# Patient Record
Sex: Female | Born: 1970
Health system: Southern US, Community
[De-identification: ages and names within clinical notes are randomized; demographics above are authoritative.]

## PROBLEM LIST (undated history)

## (undated) HISTORY — PX: APPENDECTOMY: SHX54

---

## 1998-12-24 ENCOUNTER — Other Ambulatory Visit: Admission: RE | Admit: 1998-12-24 | Discharge: 1998-12-24 | Payer: Self-pay | Admitting: *Deleted

## 1999-11-06 ENCOUNTER — Emergency Department (HOSPITAL_COMMUNITY): Admission: EM | Admit: 1999-11-06 | Discharge: 1999-11-06 | Payer: Self-pay | Admitting: *Deleted

## 2008-02-15 ENCOUNTER — Other Ambulatory Visit: Admission: RE | Admit: 2008-02-15 | Discharge: 2008-02-15 | Payer: Self-pay | Admitting: Family Medicine

## 2009-10-08 ENCOUNTER — Encounter: Admission: RE | Admit: 2009-10-08 | Discharge: 2009-10-08 | Payer: Self-pay | Admitting: Family Medicine

## 2009-10-14 ENCOUNTER — Encounter: Admission: RE | Admit: 2009-10-14 | Discharge: 2009-10-14 | Payer: Self-pay | Admitting: Family Medicine

## 2010-08-10 ENCOUNTER — Encounter: Payer: Self-pay | Admitting: Family Medicine

## 2010-09-29 ENCOUNTER — Other Ambulatory Visit: Payer: Self-pay | Admitting: Family Medicine

## 2010-09-29 DIAGNOSIS — Z1231 Encounter for screening mammogram for malignant neoplasm of breast: Secondary | ICD-10-CM

## 2010-10-01 ENCOUNTER — Ambulatory Visit
Admission: RE | Admit: 2010-10-01 | Discharge: 2010-10-01 | Disposition: A | Discharge status: Home / Self Care | Payer: Self-pay | Source: Ambulatory Visit | Attending: Family Medicine | Admitting: Family Medicine

## 2010-10-01 DIAGNOSIS — Z1231 Encounter for screening mammogram for malignant neoplasm of breast: Secondary | ICD-10-CM

## 2011-03-10 ENCOUNTER — Encounter: Payer: Self-pay | Admitting: *Deleted

## 2011-03-10 ENCOUNTER — Emergency Department (HOSPITAL_BASED_OUTPATIENT_CLINIC_OR_DEPARTMENT_OTHER)
Admission: EM | Admit: 2011-03-10 | Discharge: 2011-03-10 | Disposition: A | Discharge status: Home / Self Care | Payer: BC Managed Care – PPO | Attending: Emergency Medicine | Admitting: Emergency Medicine

## 2011-03-10 ENCOUNTER — Emergency Department (INDEPENDENT_AMBULATORY_CARE_PROVIDER_SITE_OTHER): Payer: BC Managed Care – PPO

## 2011-03-10 DIAGNOSIS — N739 Female pelvic inflammatory disease, unspecified: Secondary | ICD-10-CM | POA: Insufficient documentation

## 2011-03-10 DIAGNOSIS — R109 Unspecified abdominal pain: Secondary | ICD-10-CM

## 2011-03-10 DIAGNOSIS — D72829 Elevated white blood cell count, unspecified: Secondary | ICD-10-CM

## 2011-03-10 DIAGNOSIS — N83209 Unspecified ovarian cyst, unspecified side: Secondary | ICD-10-CM

## 2011-03-10 DIAGNOSIS — N760 Acute vaginitis: Secondary | ICD-10-CM | POA: Insufficient documentation

## 2011-03-10 DIAGNOSIS — A499 Bacterial infection, unspecified: Secondary | ICD-10-CM | POA: Insufficient documentation

## 2011-03-10 DIAGNOSIS — Z9089 Acquired absence of other organs: Secondary | ICD-10-CM

## 2011-03-10 DIAGNOSIS — B9689 Other specified bacterial agents as the cause of diseases classified elsewhere: Secondary | ICD-10-CM | POA: Insufficient documentation

## 2011-03-10 LAB — URINALYSIS, ROUTINE W REFLEX MICROSCOPIC
Bilirubin Urine: NEGATIVE
Glucose, UA: NEGATIVE mg/dL
Hgb urine dipstick: NEGATIVE
Ketones, ur: 15 mg/dL — AB
Leukocytes, UA: NEGATIVE
Nitrite: NEGATIVE
Protein, ur: NEGATIVE mg/dL
Specific Gravity, Urine: 1.014 (ref 1.005–1.030)
Urobilinogen, UA: 0.2 mg/dL (ref 0.0–1.0)
pH: 5.5 (ref 5.0–8.0)

## 2011-03-10 LAB — COMPREHENSIVE METABOLIC PANEL
ALT: 19 U/L (ref 0–35)
AST: 21 U/L (ref 0–37)
Albumin: 4.1 g/dL (ref 3.5–5.2)
Alkaline Phosphatase: 58 U/L (ref 39–117)
BUN: 10 mg/dL (ref 6–23)
CO2: 22 mEq/L (ref 19–32)
Calcium: 9.9 mg/dL (ref 8.4–10.5)
Chloride: 101 mEq/L (ref 96–112)
Creatinine, Ser: 0.5 mg/dL (ref 0.50–1.10)
GFR calc Af Amer: >60 mL/min (ref >60)
GFR calc non Af Amer: >60 mL/min (ref >60)
Glucose, Bld: 91 mg/dL (ref 70–99)
Potassium: 3.7 mEq/L (ref 3.5–5.1)
Sodium: 137 mEq/L (ref 135–145)
Total Bilirubin: 0.5 mg/dL (ref 0.3–1.2)
Total Protein: 7.6 g/dL (ref 6.0–8.3)

## 2011-03-10 LAB — CBC
HCT: 41.1 % (ref 36.0–46.0)
Hemoglobin: 13.2 g/dL (ref 12.0–15.0)
MCH: 29 pg (ref 26.0–34.0)
MCHC: 32.1 g/dL (ref 30.0–36.0)
MCV: 90.3 fL (ref 78.0–100.0)
Platelets: 244 10*3/uL (ref 150–400)
RBC: 4.55 MIL/uL (ref 3.87–5.11)
RDW: 14.3 % (ref 11.5–15.5)
WBC: 21.3 10*3/uL — ABNORMAL HIGH (ref 4.0–10.5)

## 2011-03-10 LAB — WET PREP, GENITAL
Trich, Wet Prep: NONE SEEN
Yeast Wet Prep HPF POC: NONE SEEN

## 2011-03-10 LAB — LIPASE, BLOOD: Lipase: 21 U/L (ref 11–59)

## 2011-03-10 LAB — PREGNANCY, URINE: Preg Test, Ur: NEGATIVE

## 2011-03-10 MED ORDER — AZITHROMYCIN 1 G PO PACK
PACK | ORAL | Status: AC
  Filled 2011-03-10: qty 1

## 2011-03-10 MED ORDER — METRONIDAZOLE 500 MG PO TABS: 500.0000 mg | ORAL_TABLET | Freq: Two times a day (BID) | ORAL | Status: AC

## 2011-03-10 MED ORDER — OXYCODONE-ACETAMINOPHEN 5-325 MG PO TABS: 1.0000 | ORAL_TABLET | ORAL | Status: AC | PRN

## 2011-03-10 MED ORDER — CEFTRIAXONE SODIUM 1 G IJ SOLR
1.0000 g | Freq: Once | INTRAMUSCULAR | Status: AC
  Administered 2011-03-10: 1 g via INTRAMUSCULAR
  Filled 2011-03-10: qty 1

## 2011-03-10 MED ORDER — CEFTRIAXONE SODIUM 250 MG IJ SOLR: 250.0000 mg | Freq: Once | INTRAMUSCULAR | Status: DC

## 2011-03-10 MED ORDER — IOHEXOL 300 MG/ML  SOLN
100.0000 mL | Freq: Once | INTRAMUSCULAR | Status: AC | PRN
  Administered 2011-03-10: 100 mL via INTRAVENOUS

## 2011-03-10 MED ORDER — OXYCODONE-ACETAMINOPHEN 5-325 MG PO TABS: 1.0000 | ORAL_TABLET | Freq: Once | ORAL | Status: DC

## 2011-03-10 MED ORDER — SODIUM CHLORIDE 0.9 % IV SOLN
INTRAVENOUS | Status: DC
  Administered 2011-03-10: 18:00:00 via INTRAVENOUS

## 2011-03-10 MED ORDER — AZITHROMYCIN 1 G PO PACK: 1.0000 g | PACK | Freq: Once | ORAL | Status: DC

## 2011-03-10 MED ORDER — OXYCODONE-ACETAMINOPHEN 5-325 MG PO TABS
ORAL_TABLET | ORAL | Status: AC
  Filled 2011-03-10: qty 1

## 2011-03-10 MED ORDER — AZITHROMYCIN 1 G PO PACK
1.0000 g | PACK | Freq: Once | ORAL | Status: AC
  Administered 2011-03-10: 1 g via ORAL

## 2011-03-10 MED ORDER — DEXTROSE 5 % IV SOLN
INTRAVENOUS | Status: AC
  Filled 2011-03-10: qty 1

## 2011-03-10 NOTE — ED Notes (Signed)
Pt sent here from [facility] office for eval lower abd pain, U/a clean, wbc 19.0. Denies n/v/d.

## 2011-03-10 NOTE — ED Provider Notes (Signed)
History     CSN: [id number] Arrival date & time: 03/10/2011  4:16 PM  Chief Complaint  Patient presents with  . Abdominal Pain   HPI Comments: The patient is a 40 year old who had gone to work this morning. Around 9 PM she developed lower abdominal pain. There was no vomiting or diarrhea. She denies dysuria. Her last period was about 5 weeks ago, which is a normal interval for her. There have been no prior similar episode. She was seen at a [facility] urgent care center, where a CBC showed an elevated WBC of 19,000, and she was therefore referred to the ED for further evaluation.  Patient is a 40 y.o. female presenting with abdominal pain. The history is provided by the patient and medical records. No language interpreter was used.  Abdominal Pain The primary symptoms of the illness include abdominal pain. The primary symptoms of the illness do not include fever, nausea, vomiting or diarrhea. The current episode started 6 to 12 hours ago. The onset of the illness was sudden. The problem has been gradually worsening.  The patient states that she believes she is currently not pregnant. The patient has not had a change in bowel habit. Risk factors for an acute abdominal problem include a history of abdominal surgery. Symptoms associated with the illness do not include chills.    History reviewed. No pertinent past medical history.  Past Surgical History  Procedure Date  . Appendectomy     History reviewed. No pertinent family history.  History  Substance Use Topics  . Smoking status: Never Smoker   . Smokeless tobacco: Not on file  . Alcohol Use: No    OB History    Grav Para Term Preterm Abortions TAB SAB Ect Mult Living                  Review of Systems  Constitutional: Negative.  Negative for fever and chills.  HENT: Negative for congestion and rhinorrhea.   Eyes: Negative.   Respiratory: Negative.   Cardiovascular: Negative.   Gastrointestinal: Positive for abdominal  pain. Negative for nausea, vomiting and diarrhea.  Genitourinary: Negative.   Musculoskeletal: Negative.   Neurological: Negative.   Psychiatric/Behavioral: Negative.     Physical Exam  BP 138/86  Pulse 94  Temp 98.1 F (36.7 C)  Resp 16  Wt 170 lb (77.111 kg)  SpO2 100%  LMP 02/05/2011  Physical Exam  Nursing note and vitals reviewed. Constitutional: She appears well-developed and well-nourished. Distressed: in moderate distress with abdominal pain.  Eyes: Conjunctivae and EOM are normal. Pupils are equal, round, and reactive to light. No scleral icterus.  Neck: Normal range of motion. Neck supple. No thyromegaly present.  Cardiovascular: Normal rate, regular rhythm and normal heart sounds.   Pulmonary/Chest: Effort normal and breath sounds normal.  Abdominal: There is Tenderness: she'll hands bilateral lower abdominal tenderness without mass rebound, or rigidity. Bowel sounds are decreased..  Genitourinary:       Pelvic exam showed normal female external genitalia. There was a scant vaginal discharge. Uterus is small and nontender she had bilateral adnexal tenderness, but no mass. Rectal exam was not performed.  Musculoskeletal: Normal range of motion.  Lymphadenopathy:    She has no cervical adenopathy.  Neurological: She is alert. She has normal reflexes.       No sensory or motor deficits.  Skin: Skin is warm and dry.  Psychiatric: She has a normal mood and affect. Her behavior is normal.  ED Course  Procedures  Course in the patient was and had physical examination. In the chart from [facility] was reviewed. Laboratory tests and CT of the abdomen and pelvic exam were ordered. The patient did not want pain medication at present.5:24 PM  5:46 PM   Pelvic exam performed.  6:25 PM Results for KRYSTENA, REITTER (MRN [id number]) as of 03/10/2011 18:24  Ref. Range 03/10/2011 16:25 03/10/2011 17:24  Sodium Latest Range: 135-145 mEq/L 137   Potassium Latest Range: 3.5-5.1  mEq/L 3.7   Chloride Latest Range: 96-112 mEq/L 101   CO2 Latest Range: 19-32 mEq/L 22   BUN Latest Range: 6-23 mg/dL 10   Creat Latest Range: 0.50-1.10 mg/dL 4.09   Calcium Latest Range: 8.4-10.5 mg/dL 9.9   GFR calc non Af Amer Latest Range: >60 mL/min >60   GFR calc Af Amer Latest Range: >60 mL/min >60   Glucose Latest Range: 70-99 mg/dL 91   Alkaline Phosphatase Latest Range: 39-117 U/L 58   Albumin Latest Range: 3.5-5.2 g/dL 4.1   Lipase Latest Range: 11-59 U/L 21   AST Latest Range: 0-37 U/L 21   ALT Latest Range: 0-35 U/L 19   Total Protein Latest Range: 6.0-8.3 g/dL 7.6   Total Bilirubin Latest Range: 0.3-1.2 mg/dL 0.5   WBC Latest Range: 4.0-10.5 K/uL 21.3 (H)   RBC Latest Range: 3.87-5.11 MIL/uL 4.55   HGB Latest Range: 12.0-15.0 g/dL 81.1   HCT Latest Range: 36.0-46.0 % 41.1   MCV Latest Range: 78.0-100.0 fL 90.3   MCH Latest Range: 26.0-34.0 pg 29.0   MCHC Latest Range: 30.0-36.0 g/dL 91.4   RDW Latest Range: 11.5-15.5 % 14.3   Platelets Latest Range: 150-400 K/uL 244   Preg Test, Ur No range found  NEGATIVE  Color, Urine Latest Range: YELLOW   YELLOW  Appearance Latest Range: CLEAR   CLOUDY (A)  Specific Gravity, Urine Latest Range: 1.005-1.030   1.014  pH Latest Range: 5.0-8.0   5.5  Glucose, UA Latest Range: NEGATIVE mg/dL  NEGATIVE  Bilirubin Urine Latest Range: NEGATIVE   NEGATIVE  Ketones, ur Latest Range: NEGATIVE mg/dL  15 (A)  Protein Latest Range: NEGATIVE mg/dL  NEGATIVE  Urobilinogen, UA Latest Range: 0.0-1.0 mg/dL  0.2  Nitrite Latest Range: NEGATIVE   NEGATIVE  Leukocytes, UA Latest Range: NEGATIVE   NEGATIVE  Hgb urine dipstick Latest Range: NEG*RADIOLOGY REPORT*  Clinical Data: Abdominal pain. Elevated white blood cell count.  History of appendectomy.  CT ABDOMEN AND PELVIS WITH CONTRAST  Technique: Multidetector CT imaging of the abdomen and pelvis was  performed following the standard protocol during bolus  administration of intravenous  contrast.  Contrast: 100 ml Omnipaque-300.  Comparison: None.  Findings: Lung bases are clear. No pleural or pericardial  effusion.  The gallbladder, liver, spleen, adrenal glands, pancreas, biliary  tree and kidneys appear normal. The uterus is unremarkable. The  patient has a somewhat greater volume of free pelvic fluid than  typically seen. Hypoechoic structure on the left of the uterus is  worrisome for hydrosalpinx. Small involuting right ovarian cyst  measuring 1.0 cm is identified. Stomach and small and large bowel  appear normal. No lymphadenopathy. No focal bony abnormality.  IMPRESSION:  Question pelvic inflammatory disease and hydrosalpinx on the left.  Very small involuting right ovarian cyst is noted.  Original Report Authenticated By: Bernadene Bell. Maricela Curet, M.D. ATIVE   NEGATIVE   CMET was normal.  CBC showed WBC > 21000.  UA negative, UPG negative.  7:09 PM  CT of abdomen suggests PID.    8:07 PM  Results for SHENEA, GIACOBBE (MRN [id number]) as of 03/10/2011 20:01  Ref. Range 03/10/2011 18:24 03/10/2011 19:10  WBC, Wet Prep HPF POC Latest Range: NONE SEEN   RARE (A)  Clue Cells, Wet Prep Latest Range: NONE SEEN   MODERATE (A)  Yeast, Wet Prep Latest Range: NONE SEEN   NONE SEEN   Will treat with Rocephin and Zithromax now, Percocet for pain, and Metronidazole 500 mg bid x 7 days.  She has no gynecologist, so will refer to Dr. Ernestina Penna, who is on call for GYN.   Carleene Cooper III, MD 03/10/11 2008

## 2011-03-12 LAB — GC/CHLAMYDIA PROBE AMP, GENITAL
Chlamydia, DNA Probe: NEGATIVE
GC Probe Amp, Genital: NEGATIVE

## 2011-09-17 ENCOUNTER — Other Ambulatory Visit: Payer: Self-pay | Admitting: Family Medicine

## 2011-09-17 DIAGNOSIS — Z1231 Encounter for screening mammogram for malignant neoplasm of breast: Secondary | ICD-10-CM

## 2011-10-05 ENCOUNTER — Ambulatory Visit
Admission: RE | Admit: 2011-10-05 | Discharge: 2011-10-05 | Disposition: A | Discharge status: Home / Self Care | Payer: BC Managed Care – PPO | Source: Ambulatory Visit | Attending: Family Medicine | Admitting: Family Medicine

## 2011-10-05 ENCOUNTER — Other Ambulatory Visit: Payer: Self-pay | Admitting: Family Medicine

## 2011-10-05 DIAGNOSIS — Z1231 Encounter for screening mammogram for malignant neoplasm of breast: Secondary | ICD-10-CM

## 2012-06-07 ENCOUNTER — Other Ambulatory Visit: Payer: Self-pay | Admitting: Family Medicine

## 2012-06-07 ENCOUNTER — Other Ambulatory Visit (HOSPITAL_COMMUNITY)
Admission: RE | Admit: 2012-06-07 | Discharge: 2012-06-07 | Disposition: A | Discharge status: Home / Self Care | Payer: BC Managed Care – PPO | Source: Ambulatory Visit | Attending: Family Medicine | Admitting: Family Medicine

## 2012-06-07 DIAGNOSIS — Z01419 Encounter for gynecological examination (general) (routine) without abnormal findings: Secondary | ICD-10-CM | POA: Insufficient documentation

## 2012-06-07 DIAGNOSIS — Z1151 Encounter for screening for human papillomavirus (HPV): Secondary | ICD-10-CM | POA: Insufficient documentation

## 2012-10-13 ENCOUNTER — Other Ambulatory Visit: Payer: Self-pay

## 2012-10-13 DIAGNOSIS — Z1231 Encounter for screening mammogram for malignant neoplasm of breast: Secondary | ICD-10-CM

## 2012-10-20 ENCOUNTER — Ambulatory Visit
Admission: RE | Admit: 2012-10-20 | Discharge: 2012-10-20 | Disposition: A | Discharge status: Home / Self Care | Payer: BC Managed Care – PPO | Source: Ambulatory Visit

## 2012-10-20 DIAGNOSIS — Z1231 Encounter for screening mammogram for malignant neoplasm of breast: Secondary | ICD-10-CM

## 2013-10-05 ENCOUNTER — Other Ambulatory Visit (HOSPITAL_COMMUNITY): Payer: Self-pay | Admitting: Physician Assistant

## 2013-10-05 DIAGNOSIS — Z1231 Encounter for screening mammogram for malignant neoplasm of breast: Secondary | ICD-10-CM

## 2013-10-24 ENCOUNTER — Ambulatory Visit (HOSPITAL_COMMUNITY)
Admission: RE | Admit: 2013-10-24 | Discharge: 2013-10-24 | Disposition: A | Discharge status: Home / Self Care | Payer: BC Managed Care – PPO | Source: Ambulatory Visit | Attending: Physician Assistant | Admitting: Physician Assistant

## 2013-10-24 DIAGNOSIS — Z1231 Encounter for screening mammogram for malignant neoplasm of breast: Secondary | ICD-10-CM

## 2014-10-29 ENCOUNTER — Other Ambulatory Visit (HOSPITAL_COMMUNITY): Payer: Self-pay | Admitting: Physician Assistant

## 2014-10-29 DIAGNOSIS — Z1231 Encounter for screening mammogram for malignant neoplasm of breast: Secondary | ICD-10-CM

## 2014-11-02 ENCOUNTER — Ambulatory Visit (HOSPITAL_COMMUNITY): Payer: Self-pay

## 2014-11-06 ENCOUNTER — Ambulatory Visit (HOSPITAL_COMMUNITY)
Admission: RE | Admit: 2014-11-06 | Discharge: 2014-11-06 | Disposition: A | Discharge status: Home / Self Care | Payer: BLUE CROSS/BLUE SHIELD | Source: Ambulatory Visit | Attending: Physician Assistant | Admitting: Physician Assistant

## 2014-11-06 DIAGNOSIS — Z1231 Encounter for screening mammogram for malignant neoplasm of breast: Secondary | ICD-10-CM | POA: Insufficient documentation

## 2015-10-08 ENCOUNTER — Other Ambulatory Visit: Payer: Self-pay

## 2015-10-08 DIAGNOSIS — Z1231 Encounter for screening mammogram for malignant neoplasm of breast: Secondary | ICD-10-CM

## 2015-11-07 ENCOUNTER — Ambulatory Visit
Admission: RE | Admit: 2015-11-07 | Discharge: 2015-11-07 | Disposition: A | Discharge status: Home / Self Care | Payer: 59 | Source: Ambulatory Visit

## 2015-11-07 ENCOUNTER — Ambulatory Visit: Payer: BLUE CROSS/BLUE SHIELD

## 2015-11-07 DIAGNOSIS — Z1231 Encounter for screening mammogram for malignant neoplasm of breast: Secondary | ICD-10-CM

## 2016-03-01 ENCOUNTER — Emergency Department (HOSPITAL_COMMUNITY)
Admission: EM | Admit: 2016-03-01 | Discharge: 2016-03-01 | Disposition: A | Discharge status: Home / Self Care | Payer: 59 | Attending: Emergency Medicine | Admitting: Emergency Medicine

## 2016-03-01 ENCOUNTER — Encounter (HOSPITAL_COMMUNITY): Payer: Self-pay

## 2016-03-01 DIAGNOSIS — Y929 Unspecified place or not applicable: Secondary | ICD-10-CM | POA: Diagnosis not present

## 2016-03-01 DIAGNOSIS — S0181XA Laceration without foreign body of other part of head, initial encounter: Secondary | ICD-10-CM | POA: Diagnosis not present

## 2016-03-01 DIAGNOSIS — Y9389 Activity, other specified: Secondary | ICD-10-CM | POA: Diagnosis not present

## 2016-03-01 DIAGNOSIS — Y999 Unspecified external cause status: Secondary | ICD-10-CM | POA: Diagnosis not present

## 2016-03-01 DIAGNOSIS — W25XXXA Contact with sharp glass, initial encounter: Secondary | ICD-10-CM | POA: Diagnosis not present

## 2016-03-01 MED ORDER — NAPROXEN 500 MG PO TABS: 500.0000 mg | ORAL_TABLET | Freq: Two times a day (BID) | ORAL | 0 refills | Status: DC | PRN

## 2016-03-01 MED ORDER — LIDOCAINE-EPINEPHRINE (PF) 2 %-1:200000 IJ SOLN
10.0000 mL | Freq: Once | INTRAMUSCULAR | Status: AC
  Administered 2016-03-01: 10 mL via INTRADERMAL
  Filled 2016-03-01: qty 20

## 2016-03-01 MED ORDER — HYDROCODONE-ACETAMINOPHEN 5-325 MG PO TABS: 1.0000 | ORAL_TABLET | Freq: Four times a day (QID) | ORAL | 0 refills | Status: DC | PRN

## 2016-03-01 MED ORDER — HYDROCODONE-ACETAMINOPHEN 5-325 MG PO TABS
1.0000 | ORAL_TABLET | Freq: Once | ORAL | Status: AC
  Administered 2016-03-01: 1 via ORAL
  Filled 2016-03-01: qty 1

## 2016-03-01 MED ORDER — TETANUS-DIPHTH-ACELL PERTUSSIS 5-2.5-18.5 LF-MCG/0.5 IM SUSP
0.5000 mL | Freq: Once | INTRAMUSCULAR | Status: AC
  Administered 2016-03-01: 0.5 mL via INTRAMUSCULAR
  Filled 2016-03-01: qty 0.5

## 2016-03-01 NOTE — ED Notes (Signed)
pts name is:  First: Candace Middle: Sicher Last:  Pitts Asked registration to change in system.

## 2016-03-01 NOTE — ED Provider Notes (Signed)
[facility]-EMERGENCY DEPT Provider Note   CSN: [id number] Arrival date & time: 03/01/16  1027  First Provider Contact:  None       History   Chief Complaint Chief Complaint  Patient presents with  . Head Laceration    HPI Candace Pitts is a 45 y.o. female who presents to the ED with complaints of left forehead laceration sustained approximately 1 hour prior to arrival at 10:30 AM. Patient was changing her bed sheets and the globe of her ceiling fan shattered causing glass to fall down and lacerate her forehead. She reports associated 6/10 constant throbbing nonradiating pain to the site of the laceration, worse with forehead movement, and with no treatments tried prior to arrival. She denies any ongoing bleeding and does not take any blood thinners. Denies any LOC. She is unsure of her last tetanus. She denies any vision changes, lightheadedness, recent fevers or chills, chest pain, shortness breath, abdominal pain, nausea, vomiting, numbness, tingling, focal weakness, or any other injury sustained during the accident.   The history is provided by the patient and medical records. No language interpreter was used.  Head Laceration  This is a new problem. The current episode started 1 to 2 hours ago. The problem occurs rarely. The problem has not changed since onset.Pertinent negatives include no chest pain, no abdominal pain and no shortness of breath. Exacerbated by: face movement. Nothing relieves the symptoms. She has tried nothing for the symptoms. The treatment provided no relief.    History reviewed. No pertinent past medical history.  There are no active problems to display for this patient.   Past Surgical History:  Procedure Laterality Date  . APPENDECTOMY      OB History    No data available       Home Medications    Prior to Admission medications   Medication Sig Start Date End Date Taking? Authorizing Provider  Multiple Vitamin (MULTIVITAMIN) tablet Take 1  tablet by mouth daily.      Historical Provider, MD  simethicone (GAS-X EXTRA STRENGTH) 125 MG chewable tablet Chew 250 mg by mouth once.      Historical Provider, MD  vitamin C (ASCORBIC ACID) 500 MG tablet Take 500-1,000 mg by mouth daily.      Historical Provider, MD    Family History No family history on file.  Social History Social History  Substance Use Topics  . Smoking status: Never Smoker  . Smokeless tobacco: Never Used  . Alcohol use No     Allergies   Review of patient's allergies indicates no known allergies.   Review of Systems Review of Systems  Constitutional: Negative for chills and fever.  Eyes: Negative for visual disturbance.  Respiratory: Negative for shortness of breath.   Cardiovascular: Negative for chest pain.  Gastrointestinal: Negative for abdominal pain, nausea and vomiting.  Musculoskeletal: Negative for arthralgias, back pain, myalgias and neck pain.  Skin: Positive for wound.  Allergic/Immunologic: Negative for immunocompromised state.  Neurological: Negative for syncope, weakness, light-headedness and numbness.  Hematological: Does not bruise/bleed easily.  Psychiatric/Behavioral: Negative for confusion.   10 Systems reviewed and are negative for acute change except as noted in the HPI.   Physical Exam Updated Vital Signs BP (!) 154/105   Pulse 80   Temp 97.4 F (36.3 C)   Resp 22   SpO2 100%   Physical Exam  Constitutional: She is oriented to person, place, and time. Vital signs are normal. She appears well-developed and well-nourished.  Non-toxic  appearance. No distress.  Afebrile, nontoxic, NAD  HENT:  Head: Normocephalic. Head is with laceration.    Mouth/Throat: Oropharynx is clear and moist and mucous membranes are normal.  3.5cm linear laceration above L eyebrow on the forehead, fairly superficial with the lateral edge that has a tiny flap, easily approximated, no FBs noted, no ongoing bleeding. No bruising or other injuries  to the scalp/face. No focal areas of tenderness, no scalp deformities or crepitus. SEE PICTURE BELOW  Eyes: Conjunctivae and EOM are normal. Pupils are equal, round, and reactive to light. Right eye exhibits no discharge. Left eye exhibits no discharge.  PERRL, EOMI, no nystagmus, no visual field deficits   Neck: Normal range of motion. Neck supple. No spinous process tenderness and no muscular tenderness present. No neck rigidity. Normal range of motion present.  FROM intact without spinous process TTP, no bony stepoffs or deformities, no paraspinous muscle TTP or muscle spasms. No rigidity or meningeal signs. No bruising or swelling.   Cardiovascular: Normal rate, regular rhythm, normal heart sounds and intact distal pulses.  Exam reveals no gallop and no friction rub.   No murmur heard. Pulmonary/Chest: Effort normal and breath sounds normal. No respiratory distress. She has no decreased breath sounds. She has no wheezes. She has no rhonchi. She has no rales.  Abdominal: Soft. Normal appearance and bowel sounds are normal. She exhibits no distension. There is no tenderness. There is no rigidity, no rebound, no guarding, no CVA tenderness, no tenderness at McBurney's point and negative Murphy's sign.  Musculoskeletal: Normal range of motion.  MAE x4 Strength and sensation grossly intact Distal pulses intact Gait steady  Neurological: She is alert and oriented to person, place, and time. She has normal strength. No cranial nerve deficit or sensory deficit. Coordination and gait normal. GCS eye subscore is 4. GCS verbal subscore is 5. GCS motor subscore is 6.  CN 2-12 grossly intact A&O x4 GCS 15 Sensation and strength intact Gait nonataxic Coordination with finger-to-nose WNL Neg pronator drift   Skin: Skin is warm and dry. Laceration noted. No rash noted.  L forehead lac as noted above and pictured below  Psychiatric: She has a normal mood and affect.  Nursing note and vitals  reviewed.      ED Treatments / Results  Labs (all labs ordered are listed, but only abnormal results are displayed) Labs Reviewed - No data to display  EKG  EKG Interpretation None       Radiology No results found.  Procedures .Marland Kitchen.Laceration Repair Date/Time: 03/01/2016 11:55 AM Performed by: Allen DerryAMPRUBI-SOMS, Yannis Broce Authorized by: Allen DerryAMPRUBI-SOMS, Sherline Eberwein   Consent:    Consent obtained:  Verbal   Consent given by:  Patient   Risks discussed:  Infection, pain and poor cosmetic result   Alternatives discussed:  Delayed treatment, no treatment and observation Anesthesia (see MAR for exact dosages):    Anesthesia method:  Local infiltration   Local anesthetic:  Lidocaine 2% WITH epi Laceration details:    Location:  Face   Face location:  Forehead   Length (cm):  3.5   Depth (mm):  3 Pre-procedure details:    Preparation:  Patient was prepped and draped in usual sterile fashion Exploration:    Hemostasis achieved with:  Epinephrine   Wound exploration: entire depth of wound probed and visualized     Contaminated: no   Treatment:    Area cleansed with:  Saline   Amount of cleaning:  Standard   Irrigation solution:  Sterile  saline Skin repair:    Repair method:  Sutures and tissue adhesive   Suture size:  5-0   Suture material:  Prolene   Suture technique:  Simple interrupted   Number of sutures:  1 Approximation:    Approximation:  Close   Vermilion border: well-aligned   Post-procedure details:    Dressing:  Sterile dressing   Patient tolerance of procedure:  Tolerated well, no immediate complications   (including critical care time)  Medications Ordered in ED Medications  HYDROcodone-acetaminophen (NORCO/VICODIN) 5-325 MG per tablet 1 tablet (not administered)  lidocaine-EPINEPHrine (XYLOCAINE W/EPI) 2 %-1:200000 (PF) injection 10 mL (10 mLs Intradermal Given by Other 03/01/16 1150)  Tdap (BOOSTRIX) injection 0.5 mL (0.5 mLs Intramuscular Given 03/01/16  1156)     Initial Impression / Assessment and Plan / ED Course  I have reviewed the triage vital signs and the nursing notes.  Pertinent labs & imaging results that were available during my care of the patient were reviewed by me and considered in my medical decision making (see chart for details).  Clinical Course    45 y.o. female here with head lac after a glass portion of her fan broke and sliced her forehead, linear superficial lac just above L eyebrow. No focal neuro deficits on exam, no LOC, per [place] head CT rules does not need head imaging. No FBs in wound. No blood thinner use, and no ongoing bleeding. Tdap updated. I was able to suture the edge of the wound down, and dermabond the remainder of the wound. Discussed wound care and f/up in 4-5 days for suture removal at [facility]. Pain meds given. I explained the diagnosis and have given explicit precautions to return to the ER including for any other new or worsening symptoms. The patient understands and accepts the medical plan as it's been dictated and I have answered their questions. Discharge instructions concerning home care and prescriptions have been given. The patient is STABLE and is discharged to home in good condition.   Final Clinical Impressions(s) / ED Diagnoses   Final diagnoses:  Facial laceration, initial encounter    New Prescriptions New Prescriptions   HYDROCODONE-ACETAMINOPHEN (NORCO) 5-325 MG TABLET    Take 1 tablet by mouth every 6 (six) hours as needed for severe pain.   NAPROXEN (NAPROSYN) 500 MG TABLET    Take 1 tablet (500 mg total) by mouth 2 (two) times daily as needed for mild pain, moderate pain or headache (TAKE WITH MEALS.).     Zakyra Kukuk Camprubi-Soms, PA-C 03/01/16 [address] [place], PA-C 03/01/16 1230    Tilden Fossa, MD 03/04/16 [phone]

## 2016-03-01 NOTE — ED Triage Notes (Signed)
Patient here with laceration to left forehead after hit with glass from ceiling fan light, saline dressing applied on arrival and denies loc. No bleeding, NAD

## 2016-03-01 NOTE — Discharge Instructions (Signed)
Keep wound clean with mild soap and water. Keep area covered with a topical bandage, keep bandage dry, and do not submerge in water for 24 hours. DO NOT APPLY OINTMENTS, LOTIONS, OR CREAMS TO THE AREA since this will dissolve the skin glue prematurely. Ice and elevate area for additional pain relief and swelling. Alternate between naprosyn and norco for additional pain relief, but don't drive while taking norco. Follow up with your primary care doctor or [facility] in approximately 4-5 days for wound recheck and suture removal. Monitor area for signs of infection to include, but not limited to: increasing pain, spreading redness, drainage/pus, worsening swelling, or fevers. Return to emergency department for emergent changing or worsening symptoms.

## 2016-06-18 ENCOUNTER — Other Ambulatory Visit: Payer: Self-pay | Admitting: Physician Assistant

## 2016-06-18 ENCOUNTER — Other Ambulatory Visit (HOSPITAL_COMMUNITY)
Admission: RE | Admit: 2016-06-18 | Discharge: 2016-06-18 | Disposition: A | Discharge status: Home / Self Care | Payer: 59 | Source: Ambulatory Visit | Attending: Physician Assistant | Admitting: Physician Assistant

## 2016-06-18 DIAGNOSIS — Z1151 Encounter for screening for human papillomavirus (HPV): Secondary | ICD-10-CM | POA: Diagnosis not present

## 2016-06-18 DIAGNOSIS — Z124 Encounter for screening for malignant neoplasm of cervix: Secondary | ICD-10-CM | POA: Insufficient documentation

## 2016-06-23 LAB — CYTOLOGY - PAP
Adequacy: ABSENT
Diagnosis: NEGATIVE
HPV: NOT DETECTED

## 2016-10-07 ENCOUNTER — Other Ambulatory Visit: Payer: Self-pay | Admitting: Physician Assistant

## 2016-10-07 DIAGNOSIS — Z1231 Encounter for screening mammogram for malignant neoplasm of breast: Secondary | ICD-10-CM

## 2017-01-08 ENCOUNTER — Ambulatory Visit
Admission: RE | Admit: 2017-01-08 | Discharge: 2017-01-08 | Disposition: A | Discharge status: Home / Self Care | Payer: 59 | Source: Ambulatory Visit | Attending: Physician Assistant | Admitting: Physician Assistant

## 2017-01-08 DIAGNOSIS — Z1231 Encounter for screening mammogram for malignant neoplasm of breast: Secondary | ICD-10-CM

## 2018-03-03 ENCOUNTER — Other Ambulatory Visit: Payer: Self-pay | Admitting: Physician Assistant

## 2018-03-03 DIAGNOSIS — Z1231 Encounter for screening mammogram for malignant neoplasm of breast: Secondary | ICD-10-CM

## 2018-04-01 ENCOUNTER — Ambulatory Visit
Admission: RE | Admit: 2018-04-01 | Discharge: 2018-04-01 | Disposition: A | Discharge status: Home / Self Care | Payer: BLUE CROSS/BLUE SHIELD | Source: Ambulatory Visit | Attending: Physician Assistant | Admitting: Physician Assistant

## 2018-04-01 DIAGNOSIS — Z1231 Encounter for screening mammogram for malignant neoplasm of breast: Secondary | ICD-10-CM

## 2018-12-22 ENCOUNTER — Other Ambulatory Visit: Payer: Self-pay | Admitting: Gastroenterology

## 2018-12-22 DIAGNOSIS — R7989 Other specified abnormal findings of blood chemistry: Secondary | ICD-10-CM

## 2018-12-23 ENCOUNTER — Ambulatory Visit
Admission: RE | Admit: 2018-12-23 | Discharge: 2018-12-23 | Disposition: A | Discharge status: Home / Self Care | Payer: BC Managed Care – PPO | Source: Ambulatory Visit | Attending: Gastroenterology | Admitting: Gastroenterology

## 2018-12-23 DIAGNOSIS — R7989 Other specified abnormal findings of blood chemistry: Secondary | ICD-10-CM

## 2019-03-28 ENCOUNTER — Other Ambulatory Visit: Payer: Self-pay | Admitting: Physician Assistant

## 2019-03-28 DIAGNOSIS — Z1231 Encounter for screening mammogram for malignant neoplasm of breast: Secondary | ICD-10-CM

## 2019-03-30 ENCOUNTER — Other Ambulatory Visit: Payer: Self-pay | Admitting: Physician Assistant

## 2019-03-30 DIAGNOSIS — N950 Postmenopausal bleeding: Secondary | ICD-10-CM

## 2019-03-31 LAB — HM COLONOSCOPY

## 2019-04-19 ENCOUNTER — Ambulatory Visit
Admission: RE | Admit: 2019-04-19 | Discharge: 2019-04-19 | Disposition: A | Discharge status: Home / Self Care | Payer: BC Managed Care – PPO | Source: Ambulatory Visit | Attending: Physician Assistant | Admitting: Physician Assistant

## 2019-04-19 DIAGNOSIS — N950 Postmenopausal bleeding: Secondary | ICD-10-CM

## 2019-06-26 ENCOUNTER — Other Ambulatory Visit: Payer: Self-pay

## 2019-06-26 DIAGNOSIS — Z20822 Contact with and (suspected) exposure to covid-19: Secondary | ICD-10-CM

## 2019-06-27 ENCOUNTER — Telehealth: Payer: Self-pay

## 2019-06-27 LAB — NOVEL CORONAVIRUS, NAA: SARS-CoV-2, NAA: DETECTED — AB

## 2019-06-27 NOTE — Telephone Encounter (Signed)
Patient called and was informed that her test for COVID-19  06/26/2019 was still active and had not resulted.  She verbalized understanding and she was informed that she should isolate from her husband who has just received positive result until she knows her result.

## 2019-08-18 ENCOUNTER — Other Ambulatory Visit: Payer: Self-pay | Admitting: Family Medicine

## 2019-08-18 DIAGNOSIS — Z1231 Encounter for screening mammogram for malignant neoplasm of breast: Secondary | ICD-10-CM

## 2019-08-23 ENCOUNTER — Ambulatory Visit
Admission: RE | Admit: 2019-08-23 | Discharge: 2019-08-23 | Disposition: A | Discharge status: Home / Self Care | Payer: BC Managed Care – PPO | Source: Ambulatory Visit | Attending: Family Medicine | Admitting: Family Medicine

## 2019-08-23 ENCOUNTER — Other Ambulatory Visit: Payer: Self-pay

## 2019-08-23 DIAGNOSIS — Z1231 Encounter for screening mammogram for malignant neoplasm of breast: Secondary | ICD-10-CM

## 2020-02-17 ENCOUNTER — Ambulatory Visit
Admission: EM | Admit: 2020-02-17 | Discharge: 2020-02-17 | Disposition: A | Discharge status: Home / Self Care | Payer: PRIVATE HEALTH INSURANCE | Attending: Emergency Medicine | Admitting: Emergency Medicine

## 2020-02-17 ENCOUNTER — Encounter: Payer: Self-pay | Admitting: Emergency Medicine

## 2020-02-17 ENCOUNTER — Other Ambulatory Visit: Payer: Self-pay

## 2020-02-17 DIAGNOSIS — S61412A Laceration without foreign body of left hand, initial encounter: Secondary | ICD-10-CM | POA: Diagnosis not present

## 2020-02-17 NOTE — Discharge Instructions (Addendum)
Keep wound clean and dry. Return in 1 week for removal. Return sooner for worsening pain, redness, bleeding, discharge, smell, fever.

## 2020-02-17 NOTE — ED Notes (Signed)
Patient rinsed with flowing warm tap water for 5 minutes, with cleansing with soap and water at 2 minute mark, and 5 minute mark.  Area patted dry

## 2020-02-17 NOTE — ED Provider Notes (Signed)
[facility]    CSN: [id number] Arrival date & time: 02/17/20  1355      History   Chief Complaint Chief Complaint  Patient presents with  . Laceration    HPI Candace Pitts is a 49 y.o. female presented for left hand laceration.  States this occurred shortly PTA.  Not on anticoagulants or blood thinners.  Last tetanus 4 years ago.  States this occurred on a metal crate.  Bleeding controlled PTA.  No numbness or deformity.   History reviewed. No pertinent past medical history.  There are no problems to display for this patient.   Past Surgical History:  Procedure Laterality Date  . APPENDECTOMY      OB History   No obstetric history on file.      Home Medications    Prior to Admission medications   Medication Sig Start Date End Date Taking? Authorizing Provider  Loratadine 10 MG CAPS Take 10 mg by mouth daily.    [provider]  Multiple Vitamin (MULTIVITAMIN) tablet Take 1 tablet by mouth daily.      [provider]    Family History Family History  Problem Relation Age of Onset  . Breast cancer Neg Hx     Social History Social History   Tobacco Use  . Smoking status: Never Smoker  . Smokeless tobacco: Never Used  Substance Use Topics  . Alcohol use: No  . Drug use: Not on file     Allergies   Patient has no known allergies.   Review of Systems As per HPI   Physical Exam Triage Vital Signs ED Triage Vitals  Enc Vitals Group     BP 02/17/20 1420 (!) 171/121     Pulse Rate 02/17/20 1420 (!) 107     Resp 02/17/20 1420 18     Temp 02/17/20 1420 98.1 F (36.7 C)     Temp Source 02/17/20 1420 Oral     SpO2 02/17/20 1420 96 %     Weight --      Height --      Head Circumference --      Peak Flow --      Pain Score 02/17/20 1418 7     Pain Loc --      Pain Edu? --      Excl. in GC? --    No data found.  Updated Vital Signs BP (!) 171/121 (BP Location: Left Arm)   Pulse (!) 107   Temp 98.1 F  (36.7 C) (Oral)   Resp 18   SpO2 96%   Visual Acuity Right Eye Distance:   Left Eye Distance:   Bilateral Distance:    Right Eye Near:   Left Eye Near:    Bilateral Near:     Physical Exam Constitutional:      General: She is not in acute distress. HENT:     Head: Normocephalic and atraumatic.  Eyes:     General: No scleral icterus.    Pupils: Pupils are equal, round, and reactive to light.  Cardiovascular:     Rate and Rhythm: Normal rate.  Pulmonary:     Effort: Pulmonary effort is normal.  Musculoskeletal:        General: Tenderness and signs of injury present. No swelling.  Skin:    Coloration: Skin is not jaundiced or pale.     Comments: 3 cm laceration noted to webbing between first and second digit, left hand.  NVI  Neurological:  Mental Status: She is alert and oriented to person, place, and time.      UC Treatments / Results  Labs (all labs ordered are listed, but only abnormal results are displayed) Labs Reviewed - No data to display  EKG   Radiology No results found.  Procedures Laceration Repair  Date/Time: 02/17/2020 3:18 PM Performed by: Shea Evans, PA-C Authorized by: Shea Evans, PA-C   Consent:    Consent obtained:  Verbal   Consent given by:  Patient   Risks discussed:  Infection, need for additional repair, pain, poor cosmetic result and poor wound healing   Alternatives discussed:  No treatment and delayed treatment Universal protocol:    Patient identity confirmed:  Verbally with patient Anesthesia (see MAR for exact dosages):    Anesthesia method:  Local infiltration   Local anesthetic:  Lidocaine 2% w/o epi Laceration details:    Location:  Finger   Finger location:  L thumb   Length (cm):  3   Depth (mm):  7 Repair type:    Repair type:  Simple Pre-procedure details:    Preparation:  Patient was prepped and draped in usual sterile fashion Exploration:    Hemostasis achieved with:  Direct pressure    Wound exploration: wound explored through full range of motion     Wound extent: no fascia violation noted, no foreign bodies/material noted and no tendon damage noted     Contaminated: yes   Treatment:    Area cleansed with:  Soap and water   Amount of cleaning:  Extensive   Irrigation solution:  Tap water   Irrigation volume:  5 min   Irrigation method:  Tap Skin repair:    Repair method:  Sutures   Suture size:  4-0   Suture material:  Prolene   Suture technique:  Simple interrupted and horizontal mattress   Number of sutures: 2 simple interrupted, 2 horizontal mattress. Approximation:    Approximation:  Close Post-procedure details:    Dressing:  Bulky dressing   Patient tolerance of procedure:  Tolerated well, no immediate complications   (including critical care time)  Medications Ordered in UC Medications - No data to display  Initial Impression / Assessment and Plan / UC Course  I have reviewed the triage vital signs and the nursing notes.  Pertinent labs & imaging results that were available during my care of the patient were reviewed by me and considered in my medical decision making (see chart for details).     Patient peers well in office today.  Laceration thoroughly irrigated, tetanus up-to-date, and she is immunocompetent.  Will defer antibiotics at this time.  4 sutures placed which she tolerated well.  Will return in 7-10 days to get these removed.  Return precautions discussed, pt verbalized understanding and is agreeable to plan. Final Clinical Impressions(s) / UC Diagnoses   Final diagnoses:  Laceration of left hand without foreign body, initial encounter     Discharge Instructions     Keep wound clean and dry. Return in 1 week for removal. Return sooner for worsening pain, redness, bleeding, discharge, smell, fever.    ED Prescriptions    None     PDMP not reviewed this encounter.   Hall-Potvin, [place], [place] 02/17/20 1522

## 2020-02-17 NOTE — ED Triage Notes (Signed)
Pt presents to [facility] for assessment of laceration to left hand occurring PTA>

## 2020-02-21 ENCOUNTER — Ambulatory Visit
Admission: EM | Admit: 2020-02-21 | Discharge: 2020-02-21 | Discharge status: Absent without leave | Payer: PRIVATE HEALTH INSURANCE

## 2020-02-21 ENCOUNTER — Other Ambulatory Visit: Payer: Self-pay

## 2020-02-22 ENCOUNTER — Other Ambulatory Visit: Payer: Self-pay

## 2020-02-22 ENCOUNTER — Ambulatory Visit
Admission: EM | Admit: 2020-02-22 | Discharge: 2020-02-22 | Disposition: A | Discharge status: Home / Self Care | Payer: PRIVATE HEALTH INSURANCE | Attending: Emergency Medicine | Admitting: Emergency Medicine

## 2020-02-22 ENCOUNTER — Encounter: Payer: Self-pay | Admitting: Emergency Medicine

## 2020-02-22 DIAGNOSIS — L089 Local infection of the skin and subcutaneous tissue, unspecified: Secondary | ICD-10-CM

## 2020-02-22 DIAGNOSIS — T148XXA Other injury of unspecified body region, initial encounter: Secondary | ICD-10-CM

## 2020-02-22 DIAGNOSIS — S61412A Laceration without foreign body of left hand, initial encounter: Secondary | ICD-10-CM | POA: Diagnosis not present

## 2020-02-22 MED ORDER — DOXYCYCLINE HYCLATE 100 MG PO CAPS
100.0000 mg | ORAL_CAPSULE | Freq: Two times a day (BID) | ORAL | 0 refills | Status: AC
Start: 2020-02-22 — End: 2020-02-29

## 2020-02-22 MED FILL — DOXYCYCLINE HYCLATE 100 MG: 100 | 7 days supply | Qty: 14 | Fill #0

## 2020-02-22 NOTE — Discharge Instructions (Signed)
Take antibiotic 2 times a day with food.

## 2020-02-22 NOTE — ED Triage Notes (Signed)
Pt presents to [facility] after having stitches placed this weekend to the left hand.  C/o worsening pain, redness, swelling and discharge.

## 2020-02-22 NOTE — ED Provider Notes (Signed)
[facility]    CSN: [id number] Arrival date & time: 02/22/20  0801      History   Chief Complaint Chief Complaint  Patient presents with  . Hand Pain    HPI Candace Pitts is a 49 y.o. female presenting for left hand laceration pain, swelling, erythema, discharge.  States this began the night before last.  Has been keeping clean, dry.  Sutures in place.  No bleeding, fever.  4 sutures placed by me on 7/31: well-tolerated.  History reviewed. No pertinent past medical history.  There are no problems to display for this patient.   Past Surgical History:  Procedure Laterality Date  . APPENDECTOMY      OB History   No obstetric history on file.      Home Medications    Prior to Admission medications   Medication Sig Start Date End Date Taking? Authorizing Provider  doxycycline (VIBRAMYCIN) 100 MG capsule Take 1 capsule (100 mg total) by mouth 2 (two) times daily for 7 days. 02/22/20 02/29/20  Hall-Potvin, [place], PA-C  Loratadine 10 MG CAPS Take 10 mg by mouth daily.    [provider]  Multiple Vitamin (MULTIVITAMIN) tablet Take 1 tablet by mouth daily.      [provider]    Family History Family History  Problem Relation Age of Onset  . Breast cancer Neg Hx     Social History Social History   Tobacco Use  . Smoking status: Never Smoker  . Smokeless tobacco: Never Used  Substance Use Topics  . Alcohol use: No  . Drug use: Not on file     Allergies   Patient has no known allergies.   Review of Systems As per HPI   Physical Exam Triage Vital Signs ED Triage Vitals  Enc Vitals Group     BP      Pulse      Resp      Temp      Temp src      SpO2      Weight      Height      Head Circumference      Peak Flow      Pain Score      Pain Loc      Pain Edu?      Excl. in GC?    No data found.  Updated Vital Signs BP (!) 152/82 (BP Location: Left Arm)   Pulse 72   Temp 98.1 F (36.7 C) (Oral)   Resp  18   SpO2 98%   Visual Acuity Right Eye Distance:   Left Eye Distance:   Bilateral Distance:    Right Eye Near:   Left Eye Near:    Bilateral Near:     Physical Exam Constitutional:      General: She is not in acute distress. HENT:     Head: Normocephalic and atraumatic.  Eyes:     General: No scleral icterus.    Pupils: Pupils are equal, round, and reactive to light.  Cardiovascular:     Rate and Rhythm: Normal rate.  Pulmonary:     Effort: Pulmonary effort is normal.  Musculoskeletal:        General: Tenderness present. Normal range of motion.  Skin:    Capillary Refill: Capillary refill takes less than 2 seconds.     Coloration: Skin is not jaundiced or pale.     Comments: Mild erythema surrounding laceration.  No active discharge.  Exquisite TTP.  Neurological:     General: No focal deficit present.     Mental Status: She is alert and oriented to person, place, and time.      UC Treatments / Results  Labs (all labs ordered are listed, but only abnormal results are displayed) Labs Reviewed - No data to display  EKG   Radiology No results found.  Procedures Procedures (including critical care time)  Medications Ordered in UC Medications - No data to display  Initial Impression / Assessment and Plan / UC Course  I have reviewed the triage vital signs and the nursing notes.  Pertinent labs & imaging results that were available during my care of the patient were reviewed by me and considered in my medical decision making (see chart for details).     Patient with cellulitis second to laceration.  Will start doxycycline and have patient return in a few days for suture removal and reevaluation.  Return precautions discussed, pt verbalized understanding and is agreeable to plan. Final Clinical Impressions(s) / UC Diagnoses   Final diagnoses:  Laceration of left hand without foreign body, initial encounter  Infected wound     Discharge Instructions       Take antibiotic 2 times a day with food.    ED Prescriptions    Medication Sig Dispense Auth. Provider   doxycycline (VIBRAMYCIN) 100 MG capsule Take 1 capsule (100 mg total) by mouth 2 (two) times daily for 7 days. 14 capsule Hall-Potvin, [place], PA-C     I have reviewed the PDMP during this encounter.   Hall-Potvin, [place], [place] 02/22/20 [phone]

## 2020-08-22 ENCOUNTER — Other Ambulatory Visit: Payer: Self-pay | Admitting: Family Medicine

## 2020-08-22 DIAGNOSIS — Z1231 Encounter for screening mammogram for malignant neoplasm of breast: Secondary | ICD-10-CM

## 2020-10-08 ENCOUNTER — Inpatient Hospital Stay: Admission: RE | Admit: 2020-10-08 | Payer: PRIVATE HEALTH INSURANCE | Source: Ambulatory Visit

## 2020-10-12 ENCOUNTER — Ambulatory Visit
Admission: RE | Admit: 2020-10-12 | Discharge: 2020-10-12 | Disposition: A | Discharge status: Home / Self Care | Payer: PRIVATE HEALTH INSURANCE | Source: Ambulatory Visit | Attending: Family Medicine | Admitting: Family Medicine

## 2020-10-12 DIAGNOSIS — Z1231 Encounter for screening mammogram for malignant neoplasm of breast: Secondary | ICD-10-CM

## 2020-12-10 NOTE — Progress Notes (Signed)
[facility] [address] [place] [address] Phone: [phone] Subjective:   Bruce Donath, am serving as a scribe for Dr. Antoine Primas. This visit occurred during the SARS-CoV-2 public health emergency.  Safety protocols were in place, including screening questions prior to the visit, additional usage of staff PPE, and extensive cleaning of exam room while observing appropriate contact time as indicated for disinfecting solutions.   I'm seeing this patient by the request  of:  Default, Provider, MD  CC: Foot pain  [id number]  Candace Pitts is a 50 y.o. female coming in with complaint of R foot pain. Patient states that she went for a run 8 months ago and developed pain in bottom of R foot. Rides peloton and lifts weights but pain with standing and walking. Pain in arch back to the heel. Is using OOFOS sandals, orthotics, rolls foot on roller.  Patient states that it is just a chronic discomfort.  Has not been able to workout otherwise within the low impact on the biking.     No past medical history on file. Past Surgical History:  Procedure Laterality Date  . APPENDECTOMY     Social History   Socioeconomic History  . Marital status: Single    Spouse name: Not on file  . Number of children: Not on file  . Years of education: Not on file  . Highest education level: Not on file  Occupational History  . Not on file  Tobacco Use  . Smoking status: Never Smoker  . Smokeless tobacco: Never Used  Substance and Sexual Activity  . Alcohol use: No  . Drug use: Not on file  . Sexual activity: Never    Birth control/protection: Surgical  Other Topics Concern  . Not on file  Social History Narrative  . Not on file   Social Determinants of Health   Financial Resource Strain: Not on file  Food Insecurity: Not on file  Transportation Needs: Not on file  Physical Activity: Not on file  Stress: Not on file  Social Connections: Not  on file   No Known Allergies Family History  Problem Relation Age of Onset  . Breast cancer Neg Hx       Current Outpatient Medications (Respiratory):  Marland Kitchen  Loratadine 10 MG CAPS, Take 10 mg by mouth daily.    Current Outpatient Medications (Other):  Marland Kitchen  Multiple Vitamin (MULTIVITAMIN) tablet, Take 1 tablet by mouth daily.   Reviewed prior external information including notes and imaging from  primary care provider As well as notes that were available from care everywhere and other healthcare systems.  Past medical history, social, surgical and family history all reviewed in electronic medical record.  No pertanent information unless stated regarding to the chief complaint.   Review of Systems:  No headache, visual changes, nausea, vomiting, diarrhea, constipation, dizziness, abdominal pain, skin rash, fevers, chills, night sweats, weight loss, swollen lymph nodes, body aches, joint swelling, chest pain, shortness of breath, mood changes.  Objective  Blood pressure 120/86, pulse (!) 58, height 5\' 6"  (1.676 m), weight 183 lb (83 kg), SpO2 99 %.   General: No apparent distress alert and oriented x3 mood and affect normal, dressed appropriately.  HEENT: Pupils equal, extraocular movements intact  Respiratory: Patient's speak in full sentences and does not appear short of breath  Cardiovascular: No lower extremity edema, non tender, no erythema  Gait normal with good balance and coordination.  MSK:  Non tender  with full range of motion and good stability and symmetric strength and tone of shoulders, elbows, wrist, hip, knee and ankles bilaterally.  Foot exam shows the patient does have some mild overpronation noted.  Patient has tenderness over the cuboid bone on the right compared to left.  Seems to be more inferior than the rest of the bones on the foot.  Patient is tender to palpation in this area.  No pain over the navicular prominence.  Patient once again does have some breakdown  of the transverse arch of the foot noted with bunion formation  Limited musculoskeletal ultrasound was performed and interpreted by Judi Saa  Limited ultrasound of patient's right foot shows that patient does have malalignment of the cuboid bone noted.  Otherwise no significant bony abnormality noted.  Patient does have bunion formation noted of the first metatarsal but it is extra-articular. Impression: Likely dropped cuboid but no signs of any stress fracture or occult fractures noted.    [id number]; 15 additional minutes spent for Therapeutic exercises as stated in above notes.  This included exercises focusing on stretching, strengthening, with significant focus on eccentric aspects.   Long term goals include an improvement in range of motion, strength, endurance as well as avoiding reinjury. Patient's frequency would include in 1-2 times a day, 3-5 times a week for a duration of 6-12 weeks.  Exercises for the foot include:  Stretches to help lengthen the lower leg and plantar fascia areas Theraband exercises for the lower leg and ankle to help strengthen the surrounding area- dorsiflexion, plantarflexion, inversion, eversion Massage rolling on the plantar surface of the foot with a frozen bottle, tennis ball or golf ball Towel or marble pick-ups to strengthen the plantar surface of the foot Weight bearing exercises to increase balance and overall stability  Proper technique shown and discussed handout in great detail with ATC.  All questions were discussed and answered.   Impression and Recommendations:     The above documentation has been reviewed and is accurate and complete Judi Saa, DO

## 2020-12-11 ENCOUNTER — Encounter: Payer: Self-pay | Admitting: Family Medicine

## 2020-12-11 ENCOUNTER — Ambulatory Visit (INDEPENDENT_AMBULATORY_CARE_PROVIDER_SITE_OTHER)
Admission: RE | Admit: 2020-12-11 | Discharge: 2020-12-11 | Disposition: A | Discharge status: Home / Self Care | Payer: PRIVATE HEALTH INSURANCE | Source: Ambulatory Visit | Attending: Family Medicine | Admitting: Family Medicine

## 2020-12-11 ENCOUNTER — Ambulatory Visit (INDEPENDENT_AMBULATORY_CARE_PROVIDER_SITE_OTHER): Payer: PRIVATE HEALTH INSURANCE | Admitting: Family Medicine

## 2020-12-11 ENCOUNTER — Ambulatory Visit: Payer: Self-pay

## 2020-12-11 ENCOUNTER — Other Ambulatory Visit: Payer: Self-pay

## 2020-12-11 VITALS — BP 120/86 | HR 58 | Ht 66.0 in | Wt 183.0 lb

## 2020-12-11 DIAGNOSIS — M216X9 Other acquired deformities of unspecified foot: Secondary | ICD-10-CM | POA: Insufficient documentation

## 2020-12-11 DIAGNOSIS — M79671 Pain in right foot: Secondary | ICD-10-CM

## 2020-12-11 DIAGNOSIS — M216X1 Other acquired deformities of right foot: Secondary | ICD-10-CM

## 2020-12-11 HISTORY — DX: Pain in right foot: M79.671

## 2020-12-11 NOTE — Assessment & Plan Note (Signed)
Patient with right foot pain and would seem to be more of a dropped cuboid.  Patient did have manipulation done today and significant improvement almost immediately.  Patient given exercises to help with the longitudinal and the breakdown of the transverse arch.  Discussed about rigid soled shoes.  Do not think medications will be necessary.  Patient will take 2 weeks and then start home exercises and can increase activity as tolerated.  Follow-up with me again in 6 weeks

## 2020-12-11 NOTE — Patient Instructions (Signed)
Exercises -start in 2 weeks Hiking boots, rigid soled shoe, or carbon fiber plate for 2 weeks Xray on way out Spenco Total Support Orthotics Don't lace last eye on shoes OOFOS in the house See me in 6 weeks if you need to

## 2020-12-11 NOTE — Assessment & Plan Note (Signed)
Mild breakdown of the transverse arch with bunion formation noted.  We discussed over-the-counter orthotics and home exercises.  Work with a Haematologist.  Increase activity slowly.  Follow-up again in 6 to 8 weeks

## 2021-01-21 ENCOUNTER — Ambulatory Visit: Payer: PRIVATE HEALTH INSURANCE | Admitting: Family Medicine

## 2021-10-16 ENCOUNTER — Other Ambulatory Visit: Payer: Self-pay | Admitting: Family Medicine

## 2021-10-16 DIAGNOSIS — Z1231 Encounter for screening mammogram for malignant neoplasm of breast: Secondary | ICD-10-CM

## 2021-10-23 ENCOUNTER — Ambulatory Visit
Admission: RE | Admit: 2021-10-23 | Discharge: 2021-10-23 | Disposition: A | Discharge status: Home / Self Care | Payer: PRIVATE HEALTH INSURANCE | Source: Ambulatory Visit | Attending: Family Medicine | Admitting: Family Medicine

## 2021-10-23 DIAGNOSIS — Z1231 Encounter for screening mammogram for malignant neoplasm of breast: Secondary | ICD-10-CM

## 2022-02-10 LAB — COMPREHENSIVE METABOLIC PANEL
Albumin: 4.5 (ref 3.5–5.0)
Calcium: 9.8 (ref 8.7–10.7)

## 2022-02-10 LAB — BASIC METABOLIC PANEL
CO2: 30 — AB (ref 13–22)
Chloride: 103 (ref 99–108)
Potassium: 4.8 mEq/L (ref 3.5–5.1)
Sodium: 140 (ref 137–147)

## 2022-02-10 LAB — CBC AND DIFFERENTIAL
HCT: 41 (ref 36–46)
Hemoglobin: 13.4 (ref 12.0–16.0)
Platelets: 282 10*3/uL (ref 150–400)
WBC: 7

## 2022-02-10 LAB — HEPATIC FUNCTION PANEL
ALT: 39 U/L — AB (ref 7–35)
AST: 28 (ref 13–35)
Alkaline Phosphatase: 66 (ref 25–125)
Bilirubin, Total: 0.8

## 2022-02-10 LAB — LIPID PANEL
Cholesterol: 232 — AB (ref 0–200)
HDL: 183 — AB (ref 35–70)
LDL Cholesterol: 146
LDl/HDL Ratio: 4.7

## 2022-02-10 LAB — CBC: RBC: 4.46 (ref 3.87–5.11)

## 2022-02-11 LAB — HEMOGLOBIN A1C: Hemoglobin A1C: 5.7

## 2022-03-17 IMAGING — MG MM DIGITAL SCREENING BILAT W/ TOMO AND CAD
8 series · 8 of 24 positions shown · non-contrast
Comparison: Previous exam(s).

CLINICAL DATA: Screening.

EXAM:
DIGITAL SCREENING BILATERAL MAMMOGRAM WITH TOMOSYNTHESIS AND CAD
TECHNIQUE: Bilateral screening digital craniocaudal and mediolateral oblique
mammograms were obtained. Bilateral screening digital breast
tomosynthesis was performed. The images were evaluated with
computer-aided detection.

[L MLO synth-2D]
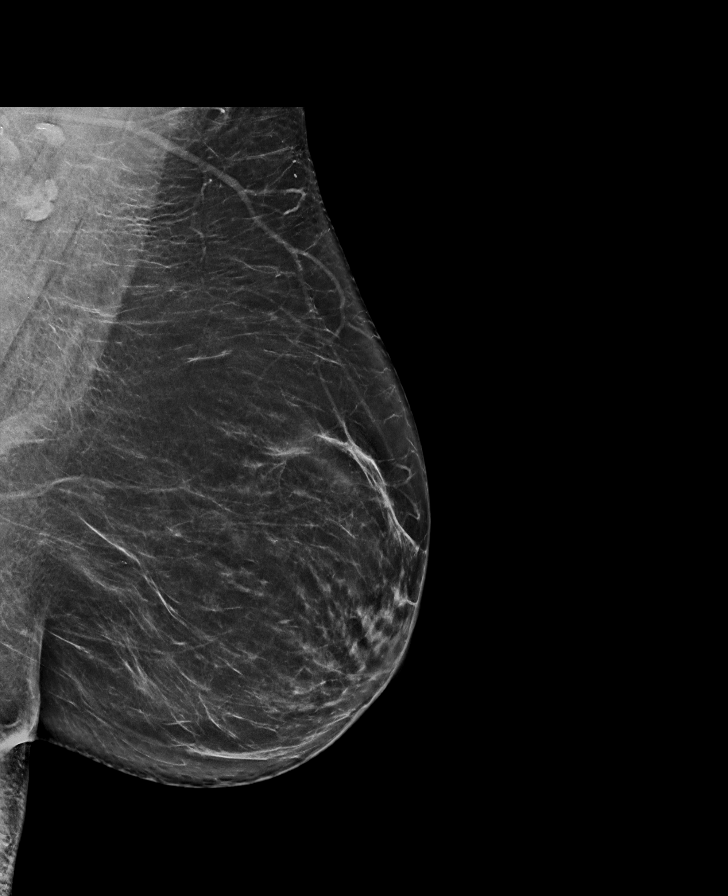

[L CC synth-2D]
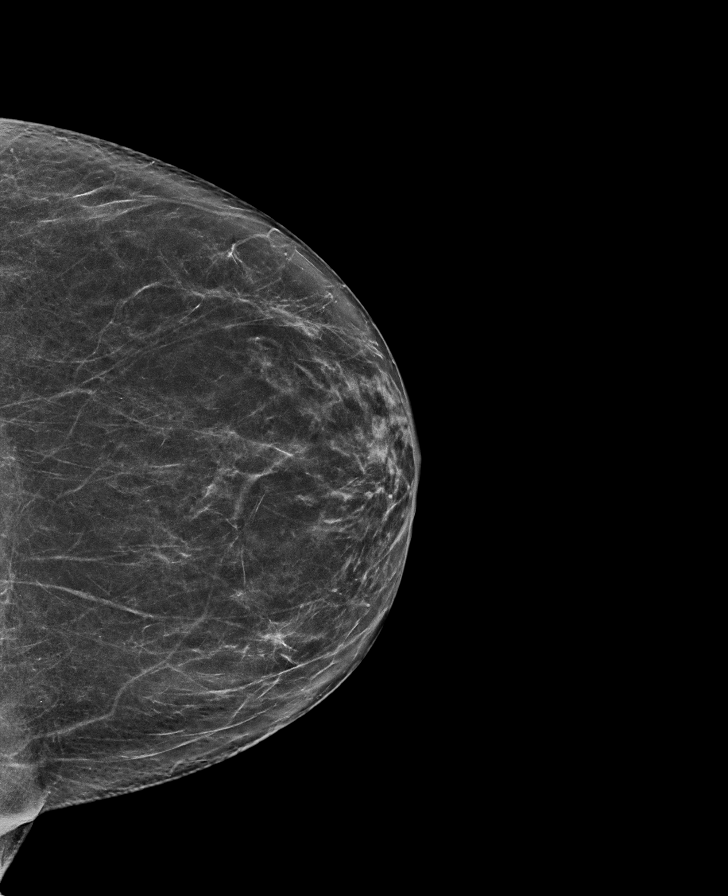

[R CC synth-2D]
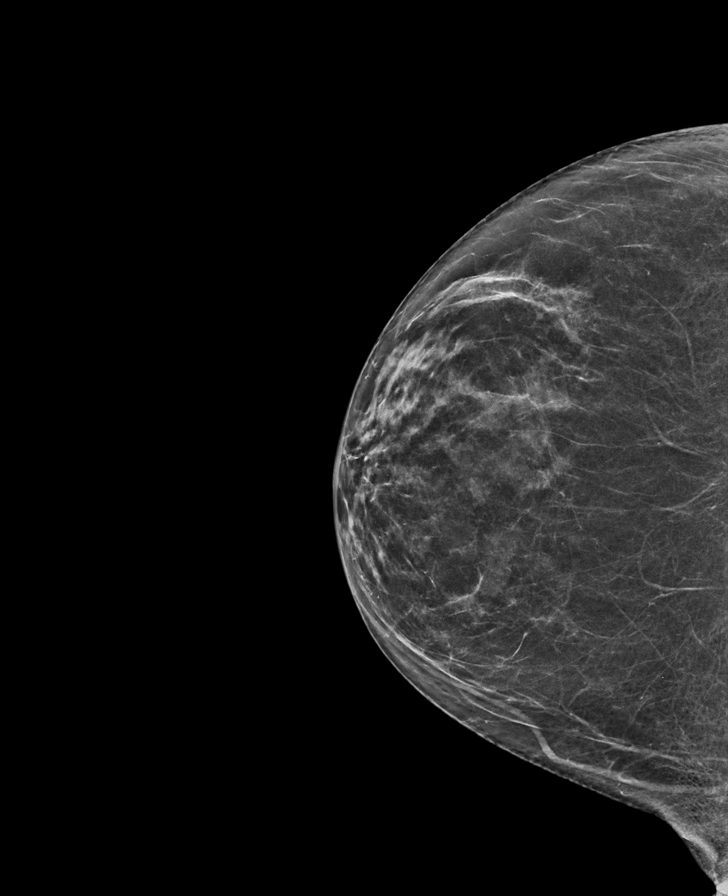

[R MLO synth-2D]
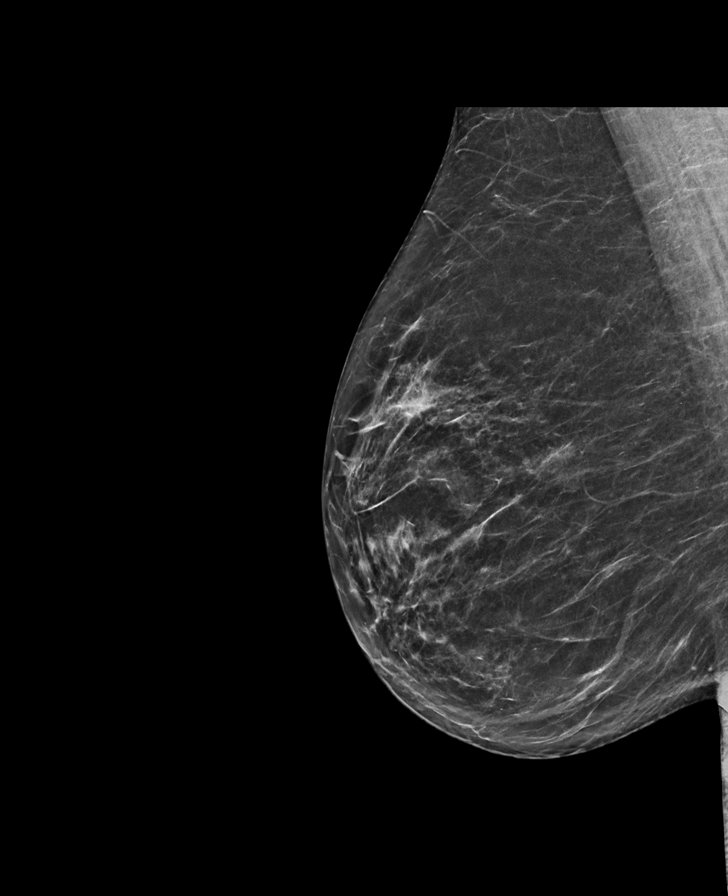

[R MLO tomo · tomo slice 35/69.0]
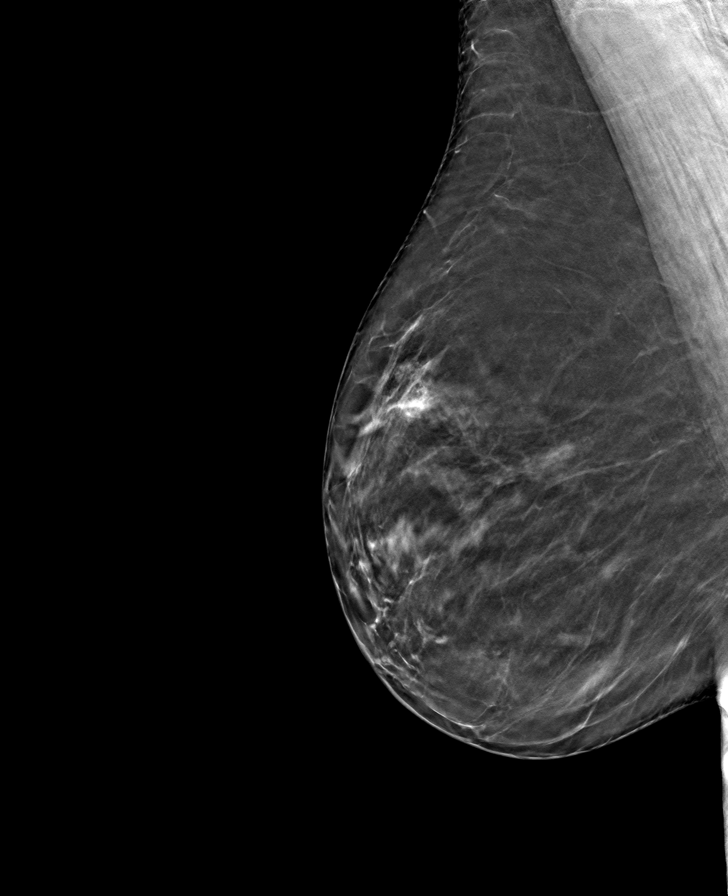

[L CC tomo · tomo slice 35/70.0]
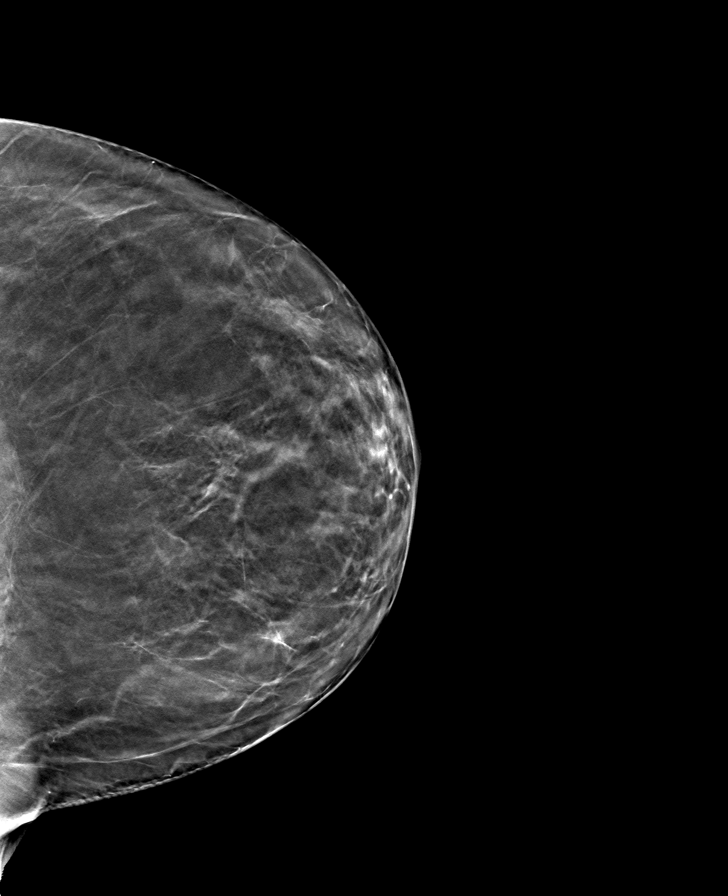

[R CC tomo · tomo slice 33/64.0]
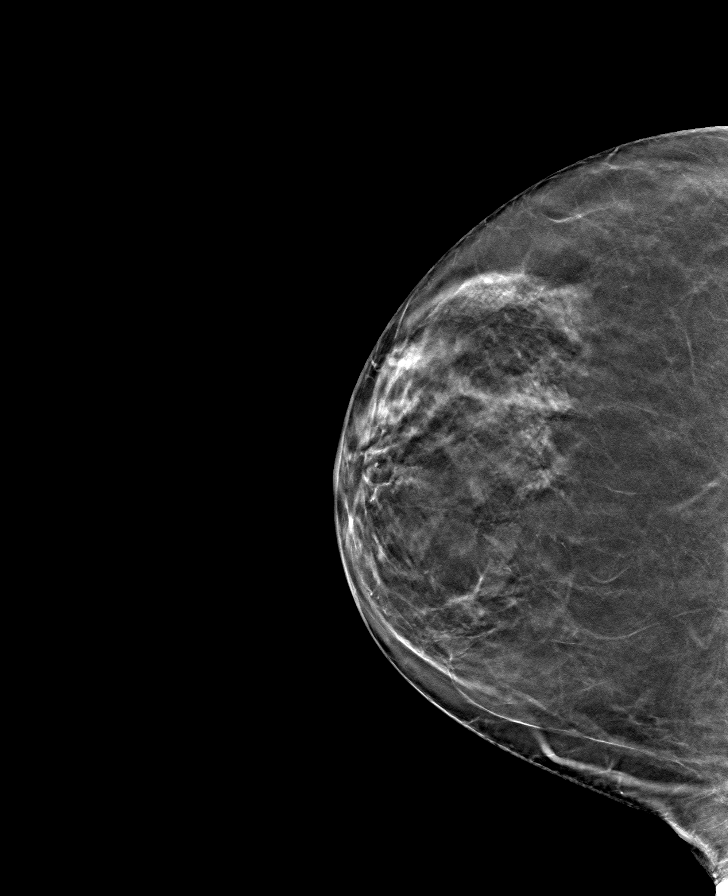

[L MLO tomo · tomo slice 42/83.0]
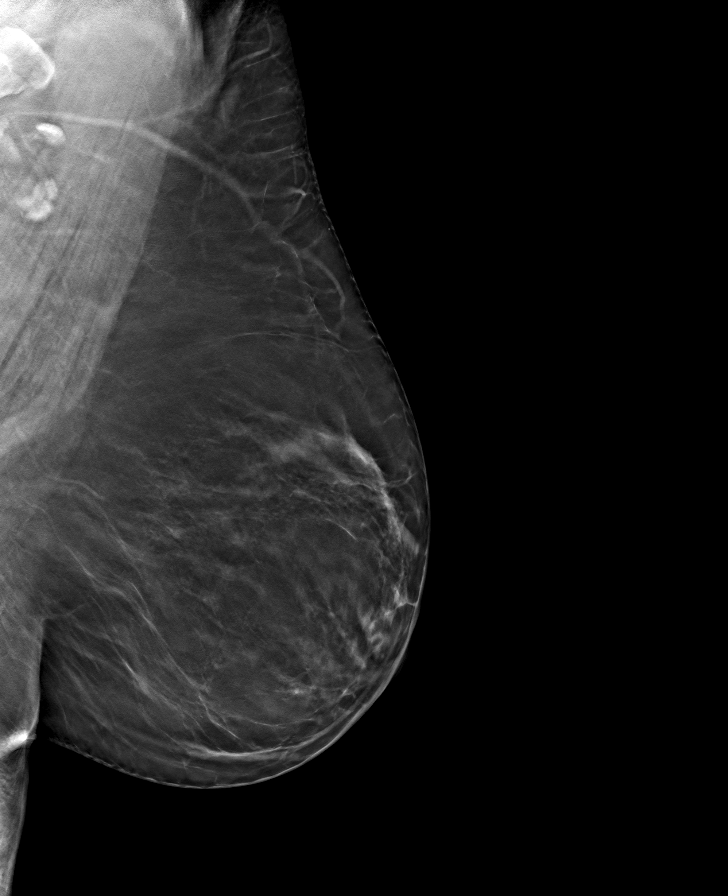

[8 of 24 positions shown; findings below may reference images not displayed]

ACR Breast Density Category b: There are scattered areas of
fibroglandular density.
FINDINGS: There are no findings suspicious for malignancy. The images were
evaluated with computer-aided detection.
IMPRESSION: No mammographic evidence of malignancy. A result letter of this
screening mammogram will be mailed directly to the patient.

RECOMMENDATION:
Screening mammogram in one year. (Code:WJ-I-BG6)

BI-RADS CATEGORY  1: Negative.

## 2022-04-27 ENCOUNTER — Other Ambulatory Visit (HOSPITAL_COMMUNITY): Payer: Self-pay | Admitting: Cardiology

## 2022-04-27 DIAGNOSIS — Z8249 Family history of ischemic heart disease and other diseases of the circulatory system: Secondary | ICD-10-CM

## 2022-05-16 IMAGING — DX DG FOOT COMPLETE 3+V*R*
3 series · 3 of 3 positions shown · non-contrast
Comparison: None.

CLINICAL DATA: Chronic right foot pain, no known injury

EXAM:
RIGHT FOOT COMPLETE - 3+ VIEW

[foot ap]
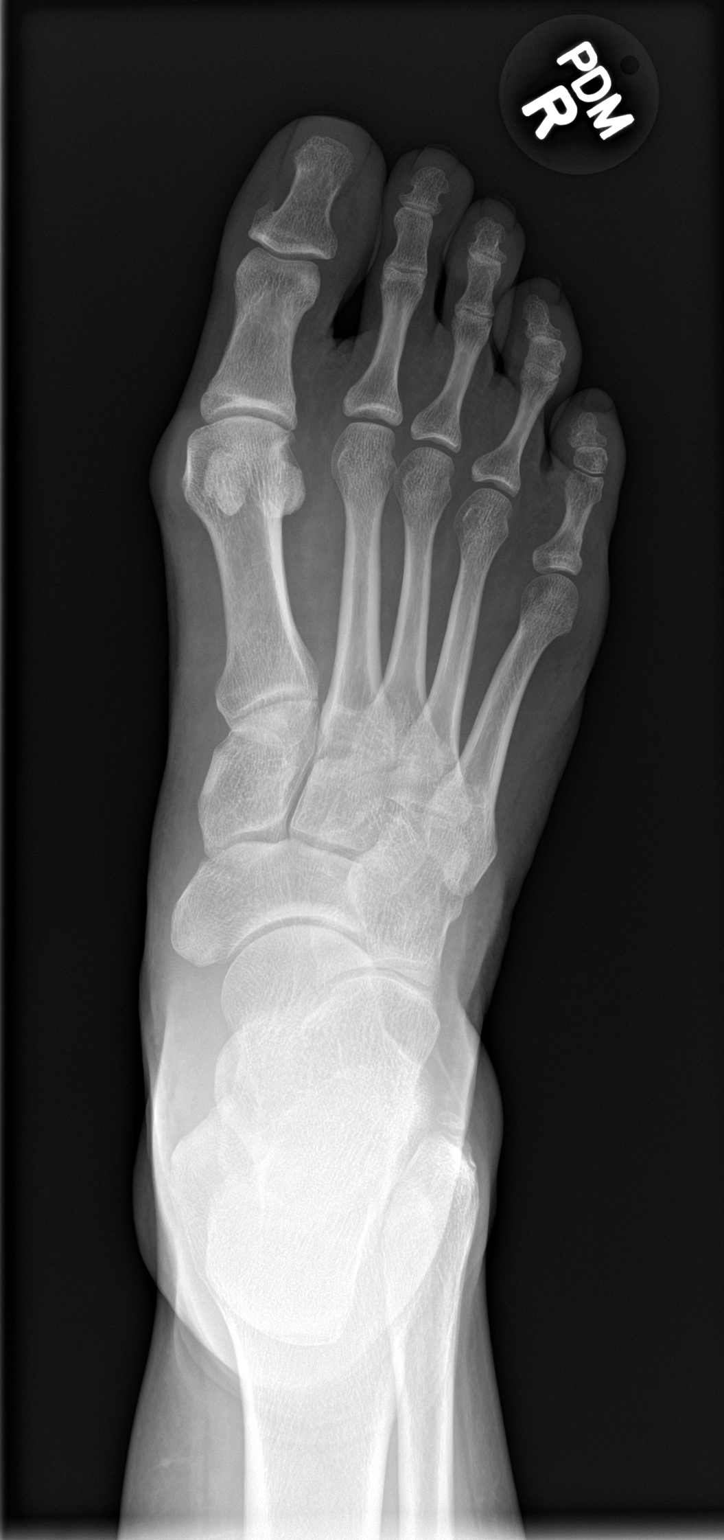

[foot obl]
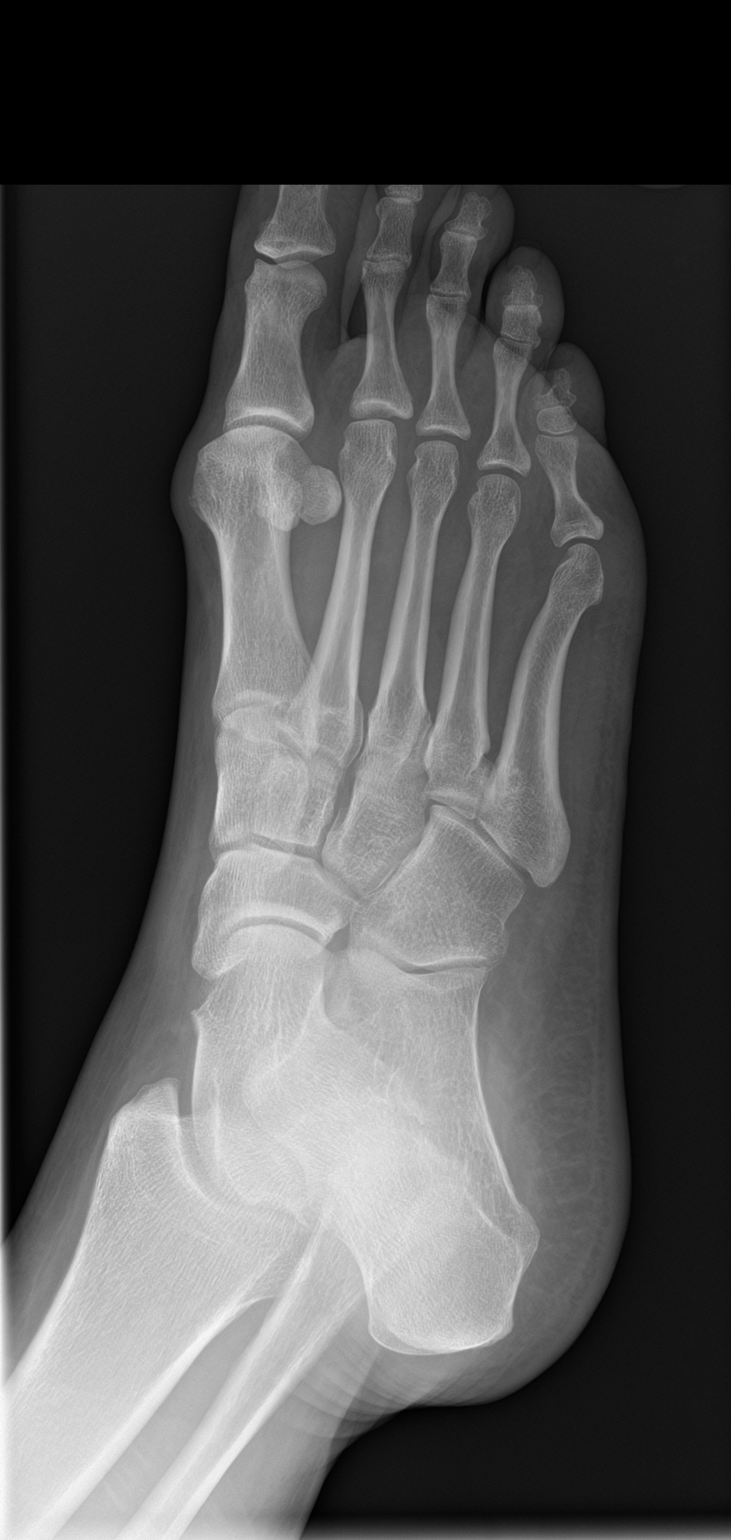

[foot lat]
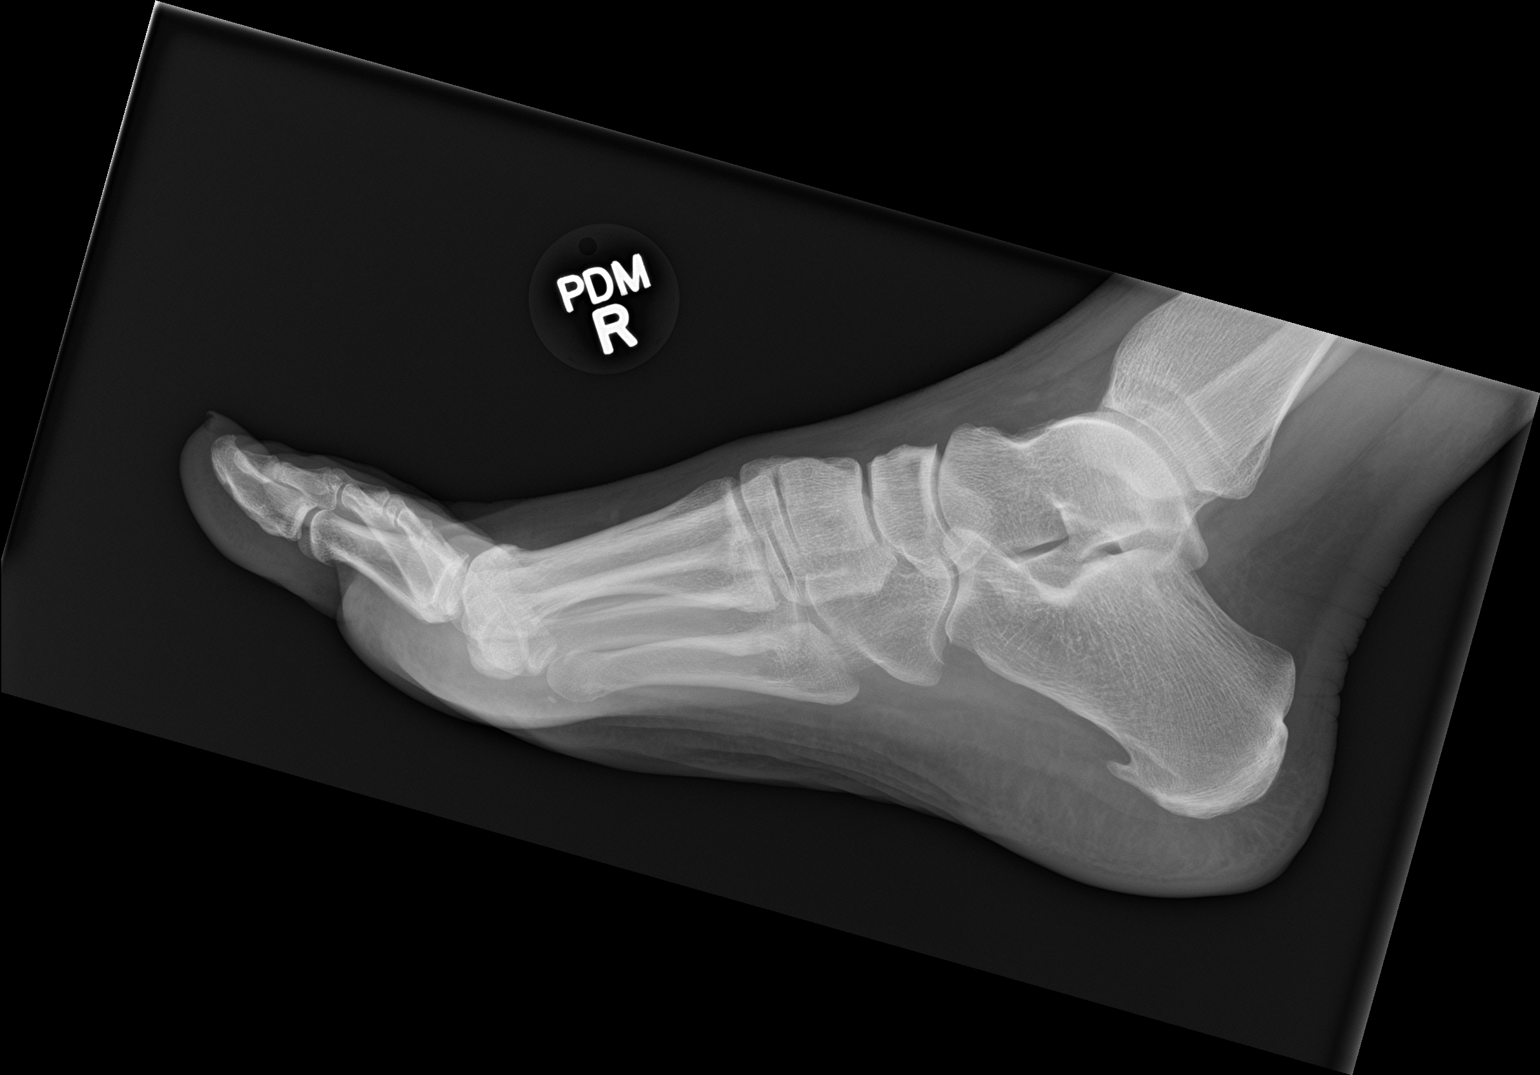

[3 of 3 positions shown; findings below may reference images not displayed]

FINDINGS: No fracture or dislocation of the right foot. There is minimal first
metatarsophalangeal arthrosis without significant bunion deformity.
Joint spaces are otherwise well preserved. Soft tissues are
unremarkable.
IMPRESSION: No fracture or dislocation of the right foot. There is minimal first
metatarsophalangeal arthrosis without significant bunion deformity.
Joint spaces are otherwise well preserved.

## 2022-05-19 ENCOUNTER — Other Ambulatory Visit (HOSPITAL_BASED_OUTPATIENT_CLINIC_OR_DEPARTMENT_OTHER): Payer: PRIVATE HEALTH INSURANCE

## 2022-06-11 ENCOUNTER — Emergency Department (HOSPITAL_BASED_OUTPATIENT_CLINIC_OR_DEPARTMENT_OTHER)
Admission: EM | Admit: 2022-06-11 | Discharge: 2022-06-11 | Disposition: A | Discharge status: Home / Self Care | Payer: PRIVATE HEALTH INSURANCE | Attending: Emergency Medicine | Admitting: Emergency Medicine

## 2022-06-11 ENCOUNTER — Other Ambulatory Visit: Payer: Self-pay

## 2022-06-11 ENCOUNTER — Encounter (HOSPITAL_BASED_OUTPATIENT_CLINIC_OR_DEPARTMENT_OTHER): Payer: Self-pay | Admitting: Emergency Medicine

## 2022-06-11 DIAGNOSIS — L03012 Cellulitis of left finger: Secondary | ICD-10-CM | POA: Diagnosis not present

## 2022-06-11 DIAGNOSIS — M795 Residual foreign body in soft tissue: Secondary | ICD-10-CM | POA: Insufficient documentation

## 2022-06-11 DIAGNOSIS — W458XXA Other foreign body or object entering through skin, initial encounter: Secondary | ICD-10-CM | POA: Insufficient documentation

## 2022-06-11 DIAGNOSIS — S60352A Superficial foreign body of left thumb, initial encounter: Secondary | ICD-10-CM | POA: Diagnosis present

## 2022-06-11 DIAGNOSIS — L03818 Cellulitis of other sites: Secondary | ICD-10-CM

## 2022-06-11 MED ORDER — CEPHALEXIN 250 MG PO CAPS
500.0000 mg | ORAL_CAPSULE | Freq: Once | ORAL | Status: AC
  Administered 2022-06-11: 500 mg via ORAL
  Filled 2022-06-11: qty 2

## 2022-06-11 MED ORDER — CEPHALEXIN 500 MG PO CAPS: 500.0000 mg | ORAL_CAPSULE | Freq: Three times a day (TID) | ORAL | 0 refills | Status: AC

## 2022-06-11 MED ORDER — CEPHALEXIN 500 MG PO CAPS: 500.0000 mg | ORAL_CAPSULE | Freq: Three times a day (TID) | ORAL | 0 refills | Status: DC

## 2022-06-11 MED ORDER — DOXYCYCLINE HYCLATE 100 MG PO TABS: 100.0000 mg | ORAL_TABLET | Freq: Once | ORAL | Status: DC

## 2022-06-11 MED ORDER — LIDOCAINE HCL (PF) 1 % IJ SOLN
2.0000 mL | Freq: Once | INTRAMUSCULAR | Status: AC
  Administered 2022-06-11: 2 mL
  Filled 2022-06-11: qty 5

## 2022-06-11 NOTE — ED Provider Notes (Signed)
[facility] EMERGENCY DEPARTMENT Provider Note   CSN: [id number] Arrival date & time: 06/11/22  1044     History  Chief Complaint  Patient presents with   Finger Injury    Candace Pitts is a 51 y.o. female.  Patient is a 51 year old female presenting for foreign body in thumb.  Patient states 2 days ago she thought she got a splinter of an unknown object in her left thumb.  States she thought her spouse remove all of the foreign body however has had significant pain and swelling since.  Patient denies any fevers, chills, myalgias.  The history is provided by the patient. No language interpreter was used.       Home Medications Prior to Admission medications   Medication Sig Start Date End Date Taking? Authorizing Provider  cephALEXin (KEFLEX) 500 MG capsule Take 1 capsule (500 mg total) by mouth 3 (three) times daily for 7 days. 06/11/22 06/18/22  Edwin Dada P, DO  Loratadine 10 MG CAPS Take 10 mg by mouth daily.    [provider]  Multiple Vitamin (MULTIVITAMIN) tablet Take 1 tablet by mouth daily.    [provider]      Allergies    Patient has no known allergies.    Review of Systems   Review of Systems  Constitutional:  Negative for chills and fever.  Skin:  Positive for color change and wound.  Neurological:  Negative for weakness and numbness.    Physical Exam Updated Vital Signs BP (!) 165/76   Pulse 84   Temp 98.7 F (37.1 C) (Oral)   Resp 17   Ht 5\' 6"  (1.676 m)   Wt 74.4 kg   SpO2 96%   BMI 26.47 kg/m  Physical Exam Vitals and nursing note reviewed.  Constitutional:      Appearance: Normal appearance.  HENT:     Head: Normocephalic.  Cardiovascular:     Rate and Rhythm: Normal rate.  Pulmonary:     Effort: Pulmonary effort is normal.  Skin:    General: Skin is warm.     Capillary Refill: Capillary refill takes less than 2 seconds.       Neurological:     Mental Status: She is alert.     ED  Results / Procedures / Treatments   Labs (all labs ordered are listed, but only abnormal results are displayed) Labs Reviewed - No data to display  EKG None  Radiology No results found.  Procedures .Foreign Body Removal  Date/Time: 06/18/2022 1:05 PM  Performed by: 06/20/2022, DO Authorized by: Franne Forts, DO  Consent: Verbal consent obtained. Consent given by: patient Patient identity confirmed: verbally with patient, hospital-assigned identification number and arm band Time out: Immediately prior to procedure a "time out" was called to verify the correct patient, procedure, equipment, support staff and site/side marked as required. Body area: skin General location: upper extremity Location details: left thumb Anesthesia: nerve block  Sedation: Patient sedated: no  Patient restrained: no 0 objects recovered. Post-procedure assessment: no foreign body found. Patient tolerance: patient tolerated the procedure well with no immediate complications      Medications Ordered in ED Medications  lidocaine (PF) (XYLOCAINE) 1 % injection 2 mL (2 mLs Infiltration Given 06/11/22 1107)  cephALEXin (KEFLEX) capsule 500 mg (500 mg Oral Given 06/11/22 1158)    ED Course/ Medical Decision Making/ A&P  Medical Decision Making Risk Prescription drug management.   53:31 PM 51 year old female presenting for foreign body in thumb. Physical exam demonstrates swelling, warmth, erythema to left thumb palmar side. Small <1 cm region of granulation tissue. No foreign bodies noted externally. Concern for developing infection/cellulitis secondary to patient and husband trying to remove a possible foreign body. Xray offered and declined. Bedside US unable to detect foreign body. Pt requesting skin probing to check. Digital nerve block performed. Small region of superficial tissue and granulation tissue removed. No foreign body recovered. Pt Dc'd home with  antibiotics for likely developing cellulitis. No signs of tenosynovitis.   Patient in no distress and overall condition improved here in the ED. Detailed discussions were had with the patient regarding current findings, and need for close f/u with PCP or on call doctor. The patient has been instructed to return immediately if the symptoms worsen in any way for re-evaluation. Patient verbalized understanding and is in agreement with current care plan. All questions answered prior to discharge.         Final Clinical Impression(s) / ED Diagnoses Final diagnoses:  Cellulitis of finger  Possible Foreign body (FB) in soft tissue    Rx / DC Orders ED Discharge Orders          Ordered    cephALEXin (KEFLEX) 500 MG capsule  3 times daily,   Status:  Discontinued        06/11/22 1155    cephALEXin (KEFLEX) 500 MG capsule  3 times daily        06/11/22 1156              Franne Forts, DO 06/18/22 1309

## 2022-06-11 NOTE — Discharge Instructions (Signed)
Today you were diagnosed with a localized skin infection secondary to a possible foreign body.  Please keep the area clean and dry.  Apply bacitracin and clean gauze daily.  Complete antibiotics to improve infection.  Return to emergency department immediately for any worsening symptoms.

## 2022-06-11 NOTE — ED Triage Notes (Signed)
Left thumb possible infection x 2 days , obvious swelling and redness , possible splinter.

## 2022-07-08 ENCOUNTER — Encounter: Payer: Self-pay | Admitting: Nurse Practitioner

## 2022-07-08 ENCOUNTER — Ambulatory Visit (INDEPENDENT_AMBULATORY_CARE_PROVIDER_SITE_OTHER): Payer: PRIVATE HEALTH INSURANCE | Admitting: Nurse Practitioner

## 2022-07-08 VITALS — BP 145/81 | HR 64 | Resp 20 | Ht 64.57 in | Wt 165.0 lb

## 2022-07-08 DIAGNOSIS — R5383 Other fatigue: Secondary | ICD-10-CM | POA: Diagnosis not present

## 2022-07-08 DIAGNOSIS — R7301 Impaired fasting glucose: Secondary | ICD-10-CM

## 2022-07-08 DIAGNOSIS — Z0000 Encounter for general adult medical examination without abnormal findings: Secondary | ICD-10-CM

## 2022-07-08 DIAGNOSIS — E559 Vitamin D deficiency, unspecified: Secondary | ICD-10-CM

## 2022-07-08 DIAGNOSIS — R41840 Attention and concentration deficit: Secondary | ICD-10-CM | POA: Diagnosis not present

## 2022-07-08 DIAGNOSIS — Z7689 Persons encountering health services in other specified circumstances: Secondary | ICD-10-CM

## 2022-07-08 DIAGNOSIS — Z23 Encounter for immunization: Secondary | ICD-10-CM

## 2022-07-08 NOTE — Progress Notes (Signed)
New Patient Office Visit  Subjective    Patient ID: Candace Pitts, female    DOB: [birth year]  Age: 51 y.o. MRN: [id number]  CC:  Chief Complaint  Patient presents with   Establish Care    HPI Candace Pitts presents to establish care -transferring from [facility] providers.  Problems with focus and concentration.  --has been a problem for last 1 to 1.5 years and gradually worsening.  -starting to effect her job. Not yet getting into trouble.  -she is having trouble with staying on task. Takes longer to finish work assignments. Jumps around a lot  -she has started going to [facility] approximately 9 weeks ago.  --she has lost approximately 25 pounds.  --increased energy levels.   Blood pressure is mildly elevated . -she takes her blood pressure every morning at home.  -generally runs around 120/80  -when she comes in to doctor's office, it always goes up.   Outpatient Encounter Medications as of 07/08/2022  Medication Sig   Loratadine 10 MG CAPS Take 10 mg by mouth daily.   Multiple Vitamin (MULTIVITAMIN) tablet Take 1 tablet by mouth daily.   No facility-administered encounter medications on file as of 07/08/2022.    History reviewed. No pertinent past medical history.  Past Surgical History:  Procedure Laterality Date   APPENDECTOMY      Family History  Problem Relation Age of Onset   Breast cancer Neg Hx     Social History   Socioeconomic History   Marital status: Married    Spouse name: Not on file   Number of children: Not on file   Years of education: Not on file   Highest education level: Not on file  Occupational History   Not on file  Tobacco Use   Smoking status: Never    Passive exposure: Never   Smokeless tobacco: Never  Substance and Sexual Activity   Alcohol use: No   Drug use: Not on file   Sexual activity: Never    Birth control/protection: Surgical  Other Topics Concern   Not on file  Social History  Narrative   Not on file   Social Determinants of Health   Financial Resource Strain: Not on file  Food Insecurity: Not on file  Transportation Needs: Not on file  Physical Activity: Not on file  Stress: Not on file  Social Connections: Not on file  Intimate Partner Violence: Not on file    Review of Systems  Constitutional:  Positive for malaise/fatigue. Negative for chills and fever.  HENT:  Negative for congestion, sinus pain and sore throat.   Eyes: Negative.   Respiratory:  Negative for cough, shortness of breath and wheezing.   Cardiovascular:  Negative for chest pain, palpitations and leg swelling.       Elevated blood pressure   Gastrointestinal:  Negative for constipation, diarrhea, nausea and vomiting.  Genitourinary: Negative.   Musculoskeletal:  Negative for myalgias.  Skin: Negative.   Neurological:  Negative for dizziness and headaches.  Endo/Heme/Allergies:  Does not bruise/bleed easily.  Psychiatric/Behavioral:  Negative for depression. The patient is nervous/anxious.        Concentration deficit.         Objective    Today's Vitals   07/08/22 1052  BP: (Abnormal) 145/81  Pulse: 64  Resp: 20  SpO2: 100%  Weight: 165 lb (74.8 kg)  Height: 5' 4.57" (1.64 m)   Body mass index is 27.83 kg/m.   Physical Exam  Vitals and nursing note reviewed.  Constitutional:      Appearance: Normal appearance. She is well-developed.  HENT:     Head: Normocephalic and atraumatic.     Nose: Nose normal.     Mouth/Throat:     Mouth: Mucous membranes are moist.     Pharynx: Oropharynx is clear.  Eyes:     Conjunctiva/sclera: Conjunctivae normal.     Pupils: Pupils are equal, round, and reactive to light.  Cardiovascular:     Rate and Rhythm: Normal rate and regular rhythm.     Pulses: Normal pulses.     Heart sounds: Normal heart sounds.  Pulmonary:     Effort: Pulmonary effort is normal.     Breath sounds: Normal breath sounds.  Abdominal:     Palpations:  Abdomen is soft.  Musculoskeletal:        General: Normal range of motion.     Cervical back: Normal range of motion and neck supple.  Lymphadenopathy:     Cervical: No cervical adenopathy.  Skin:    General: Skin is warm and dry.     Capillary Refill: Capillary refill takes less than 2 seconds.  Neurological:     General: No focal deficit present.     Mental Status: She is alert and oriented to person, place, and time.  Psychiatric:        Mood and Affect: Mood normal.        Behavior: Behavior normal.        Thought Content: Thought content normal.        Judgment: Judgment normal.       Assessment & Plan:  1. Other fatigue Check thyroid panel for further evaluation  - TSH + free T4  2. Impaired fasting glucose Check labs with HgbA1c and discuss at next visit in 4 weeks.  - Hemoglobin A1c - Comprehensive metabolic panel - CBC  3. Vitamin D deficiency Check vitamin d level and treat deficiency as indicated.   - VITAMIN D 25 Hydroxy (Vit-D Deficiency, Fractures)  4. Concentration deficit Follow up visit scheduled in 4 weeks. Will assess for ADHD at that time.   5. Need for influenza vaccination Flu vaccine administered during today's visit.  - Flu Vaccine QUAD 6+ mos PF IM (Fluarix Quad PF)  6. Healthcare maintenance Routine, fasting labs drawn during today's visit.  - TSH + free T4 - Hemoglobin A1c - Lipid panel - Comprehensive metabolic panel - CBC   7. Encounter to establish care  Appointment today to establish new primary care provider     Problem List Items Addressed This Visit       Endocrine   Impaired fasting glucose   Relevant Orders   Hemoglobin A1c (Completed)   Comprehensive metabolic panel (Completed)   CBC (Completed)     Other   Other fatigue - Primary   Relevant Orders   TSH + free T4 (Completed)   Vitamin D deficiency   Relevant Orders   VITAMIN D 25 Hydroxy (Vit-D Deficiency, Fractures) (Completed)   Concentration deficit    Other Visit Diagnoses     Need for influenza vaccination       Relevant Orders   Flu Vaccine QUAD 6+ mos PF IM (Fluarix Quad PF) (Completed)   Healthcare maintenance       Relevant Orders   TSH + free T4 (Completed)   Hemoglobin A1c (Completed)   Lipid panel (Completed)   Comprehensive metabolic panel (Completed)   CBC (Completed)  Encounter to establish care           Return in about 4 weeks (around 08/05/2022) for assessment for ADD - review labs .   Ronnell Freshwater, NP

## 2022-07-09 LAB — CBC
Hematocrit: 44.2 % (ref 34.0–46.6)
Hemoglobin: 14.2 g/dL (ref 11.1–15.9)
MCH: 29.5 pg (ref 26.6–33.0)
MCHC: 32.1 g/dL (ref 31.5–35.7)
MCV: 92 fL (ref 79–97)
Platelets: 267 10*3/uL (ref 150–450)
RBC: 4.82 x10E6/uL (ref 3.77–5.28)
RDW: 12.5 % (ref 11.7–15.4)
WBC: 6.9 10*3/uL (ref 3.4–10.8)

## 2022-07-09 LAB — COMPREHENSIVE METABOLIC PANEL
ALT: 33 IU/L — ABNORMAL HIGH (ref 0–32)
AST: 22 IU/L (ref 0–40)
Albumin/Globulin Ratio: 2 (ref 1.2–2.2)
Albumin: 4.9 g/dL (ref 3.8–4.9)
Alkaline Phosphatase: 78 IU/L (ref 44–121)
BUN/Creatinine Ratio: 22 (ref 9–23)
BUN: 15 mg/dL (ref 6–24)
Bilirubin Total: 0.5 mg/dL (ref 0.0–1.2)
CO2: 26 mmol/L (ref 20–29)
Calcium: 10.3 mg/dL — ABNORMAL HIGH (ref 8.7–10.2)
Chloride: 98 mmol/L (ref 96–106)
Creatinine, Ser: 0.69 mg/dL (ref 0.57–1.00)
Globulin, Total: 2.4 g/dL (ref 1.5–4.5)
Glucose: 104 mg/dL — ABNORMAL HIGH (ref 70–99)
Potassium: 4.3 mmol/L (ref 3.5–5.2)
Sodium: 139 mmol/L (ref 134–144)
Total Protein: 7.3 g/dL (ref 6.0–8.5)
eGFR: 104 mL/min/{1.73_m2} (ref >59)

## 2022-07-09 LAB — VITAMIN D 25 HYDROXY (VIT D DEFICIENCY, FRACTURES): Vit D, 25-Hydroxy: 33.6 ng/mL (ref 30.0–100.0)

## 2022-07-09 LAB — TSH+FREE T4
Free T4: 1.38 ng/dL (ref 0.82–1.77)
TSH: 1.96 u[IU]/mL (ref 0.450–4.500)

## 2022-07-09 LAB — HEMOGLOBIN A1C
Est. average glucose Bld gHb Est-mCnc: 114 mg/dL
Hgb A1c MFr Bld: 5.6 % (ref 4.8–5.6)

## 2022-07-09 LAB — LIPID PANEL
Chol/HDL Ratio: 3.6 ratio (ref 0.0–4.4)
Cholesterol, Total: 219 mg/dL — ABNORMAL HIGH (ref 100–199)
HDL: 61 mg/dL (ref >39)
LDL Chol Calc (NIH): 143 mg/dL — ABNORMAL HIGH (ref 0–99)
Triglycerides: 86 mg/dL (ref 0–149)
VLDL Cholesterol Cal: 15 mg/dL (ref 5–40)

## 2022-07-15 ENCOUNTER — Encounter: Payer: Self-pay | Admitting: Nurse Practitioner

## 2022-07-26 DIAGNOSIS — R5383 Other fatigue: Secondary | ICD-10-CM | POA: Insufficient documentation

## 2022-07-26 DIAGNOSIS — R7301 Impaired fasting glucose: Secondary | ICD-10-CM | POA: Insufficient documentation

## 2022-07-26 DIAGNOSIS — E559 Vitamin D deficiency, unspecified: Secondary | ICD-10-CM | POA: Insufficient documentation

## 2022-07-26 DIAGNOSIS — R41840 Attention and concentration deficit: Secondary | ICD-10-CM | POA: Insufficient documentation

## 2022-07-28 ENCOUNTER — Telehealth: Payer: No Typology Code available for payment source | Admitting: Family Medicine

## 2022-07-28 ENCOUNTER — Other Ambulatory Visit (HOSPITAL_BASED_OUTPATIENT_CLINIC_OR_DEPARTMENT_OTHER): Payer: Self-pay

## 2022-07-28 DIAGNOSIS — J019 Acute sinusitis, unspecified: Secondary | ICD-10-CM | POA: Diagnosis not present

## 2022-07-28 DIAGNOSIS — B9689 Other specified bacterial agents as the cause of diseases classified elsewhere: Secondary | ICD-10-CM

## 2022-07-28 MED ORDER — AMOXICILLIN-POT CLAVULANATE 875-125 MG PO TABS
1.0000 | ORAL_TABLET | Freq: Two times a day (BID) | ORAL | 0 refills | Status: AC
  Filled 2022-07-28: qty 14, 7d supply, fill #0

## 2022-07-28 MED ORDER — PROMETHAZINE-DM 6.25-15 MG/5ML PO SYRP
5.0000 mL | ORAL_SOLUTION | Freq: Three times a day (TID) | ORAL | 0 refills | Status: DC | PRN
  Filled 2022-07-28: qty 118, 8d supply, fill #0

## 2022-07-28 NOTE — Progress Notes (Signed)
Virtual Visit Consent   Candace Pitts, you are scheduled for a virtual visit with a [facility] provider today. Just as with appointments in the office, your consent must be obtained to participate. Your consent will be active for this visit and any virtual visit you may have with one of our providers in the next 365 days. If you have a MyChart account, a copy of this consent can be sent to you electronically.  As this is a virtual visit, video technology does not allow for your provider to perform a traditional examination. This may limit your provider's ability to fully assess your condition. If your provider identifies any concerns that need to be evaluated in person or the need to arrange testing (such as labs, EKG, etc.), we will make arrangements to do so. Although advances in technology are sophisticated, we cannot ensure that it will always work on either your end or our end. If the connection with a video visit is poor, the visit may have to be switched to a telephone visit. With either a video or telephone visit, we are not always able to ensure that we have a secure connection.  By engaging in this virtual visit, you consent to the provision of healthcare and authorize for your insurance to be billed (if applicable) for the services provided during this visit. Depending on your insurance coverage, you may receive a charge related to this service.  I need to obtain your verbal consent now. Are you willing to proceed with your visit today? Candace Pitts has provided verbal consent on 07/28/2022 for a virtual visit (video or telephone). Candace Mayo, NP  Date: 07/28/2022 9:37 AM  Virtual Visit via Video Note   I, Candace Pitts, connected with  Candace Pitts  ([id number], [birth year]) on 07/28/22 at  9:30 AM EST by a video-enabled telemedicine application and verified that I am speaking with the correct person using two identifiers.  Location: Patient: Virtual Visit  Location Patient: Home Provider: Virtual Visit Location Provider: Home Office   I discussed the limitations of evaluation and management by telemedicine and the availability of in person appointments. The patient expressed understanding and agreed to proceed.    History of Present Illness: Candace Pitts is a 52 y.o. who identifies as a female who was assigned female at birth, and is being seen today for feeling sick post finding out about husband being sick with RSV. Started getting sick a week ago-with diarrhea, and GI symptoms. In last two days she has a lot of sinus congestion and symptoms- sinus pain and pressure. Has been using mucinex and her husbands nasal spray-it worked, but she does not recall the name at this time. Denies chest pain, shortness of breath, sore throat or ear pain, fevers or chills.  Problems:  Patient Active Problem List   Diagnosis Date Noted   Other fatigue 07/26/2022   Impaired fasting glucose 07/26/2022   Vitamin D deficiency 07/26/2022   Concentration deficit 07/26/2022   Right foot pain 12/11/2020   Loss of transverse plantar arch 12/11/2020    Allergies: No Known Allergies Medications:  Current Outpatient Medications:    Loratadine 10 MG CAPS, Take 10 mg by mouth daily., Disp: , Rfl:    Multiple Vitamin (MULTIVITAMIN) tablet, Take 1 tablet by mouth daily., Disp: , Rfl:   Observations/Objective: Patient is well-developed, well-nourished in no acute distress.  Resting comfortably  at home.  Head is normocephalic, atraumatic.  No labored breathing.  Speech  is clear and coherent with logical content.  Patient is alert and oriented at baseline.  Cough and congestion noted  Assessment and Plan:  1. Acute bacterial sinusitis  - promethazine-dextromethorphan (PROMETHAZINE-DM) 6.25-15 MG/5ML syrup; Take 5 mLs by mouth 3 (three) times daily as needed for cough.  Dispense: 118 mL; Refill: 0 - amoxicillin-clavulanate (AUGMENTIN) 875-125 MG tablet;  Take 1 tablet by mouth 2 (two) times daily for 7 days.  Dispense: 14 tablet; Refill: 0  -bacterial post known RSV exposure within the home. -symptoms dramatically worsen in last 2 days. -will treat for sinus infection with symptom management  -Take meds as prescribed -Rest -Use a cool mist humidifier especially during the winter months when heat dries out the air. - Use saline nose sprays frequently to help soothe nasal passages and promote drainage. -Saline irrigations of the nose can be very helpful if done frequently.             * 4X daily for 1 week*             * Use of a nettie pot can be helpful with this.  *Follow directions with this* *Boiled or distilled water only -stay hydrated by drinking plenty of fluids - Keep thermostat turn down low to prevent drying out sinuses - For any cough or congestion- robitussin DM or Delsym as needed - For fever or aches or pains- take tylenol or ibuprofen as directed on bottle             * for fevers greater than 101 orally you may alternate ibuprofen and tylenol every 3 hours.  If you do not improve you will need a follow up visit in person.                Reviewed side effects, risks and benefits of medication.    Patient acknowledged agreement and understanding of the plan.   Past Medical, Surgical, Social History, Allergies, and Medications have been Reviewed.     Follow Up Instructions: I discussed the assessment and treatment plan with the patient. The patient was provided an opportunity to ask questions and all were answered. The patient agreed with the plan and demonstrated an understanding of the instructions.  A copy of instructions were sent to the patient via MyChart unless otherwise noted below.    The patient was advised to call back or seek an in-person evaluation if the symptoms worsen or if the condition fails to improve as anticipated.  Time:  I spent 10 minutes with the patient via telehealth technology  discussing the above problems/concerns.    Freddy Finner, NP

## 2022-07-28 NOTE — Patient Instructions (Signed)
Candace Pitts, thank you for joining Perlie Mayo, NP for today's virtual visit.  While this provider is not your primary care provider (PCP), if your PCP is located in our provider database this encounter information will be shared with them immediately following your visit.   [facility] account gives you access to today's visit and all your visits, tests, and labs performed at [facility] " click here if you don't have a [facility] account or go to [email or web address]  Consent: (Patient) Candace Pitts provided verbal consent for this virtual visit at the beginning of the encounter.  Current Medications:  Current Outpatient Medications:    amoxicillin-clavulanate (AUGMENTIN) 875-125 MG tablet, Take 1 tablet by mouth 2 (two) times daily for 7 days., Disp: 14 tablet, Rfl: 0   promethazine-dextromethorphan (PROMETHAZINE-DM) 6.25-15 MG/5ML syrup, Take 5 mLs by mouth 3 (three) times daily as needed for cough., Disp: 118 mL, Rfl: 0   Loratadine 10 MG CAPS, Take 10 mg by mouth daily., Disp: , Rfl:    Multiple Vitamin (MULTIVITAMIN) tablet, Take 1 tablet by mouth daily., Disp: , Rfl:    Medications ordered in this encounter:  Meds ordered this encounter  Medications   promethazine-dextromethorphan (PROMETHAZINE-DM) 6.25-15 MG/5ML syrup    Sig: Take 5 mLs by mouth 3 (three) times daily as needed for cough.    Dispense:  118 mL    Refill:  0    Order Specific Question:   Supervising Provider    Answer:   Chase Picket [id number]   amoxicillin-clavulanate (AUGMENTIN) 875-125 MG tablet    Sig: Take 1 tablet by mouth 2 (two) times daily for 7 days.    Dispense:  14 tablet    Refill:  0    Order Specific Question:   Supervising Provider    Answer:   Chase Picket [id number]     *If you need refills on other medications prior to your next appointment, please contact your pharmacy*  Follow-Up: Call back or seek an in-person  evaluation if the symptoms worsen or if the condition fails to improve as anticipated.  [facility] [phone]  Other Instructions  -Take meds as prescribed -Rest -Use a cool mist humidifier especially during the winter months when heat dries out the air. - Use saline nose sprays frequently to help soothe nasal passages and promote drainage. -Saline irrigations of the nose can be very helpful if done frequently.             * 4X daily for 1 week*             * Use of a nettie pot can be helpful with this.  *Follow directions with this* *Boiled or distilled water only -stay hydrated by drinking plenty of fluids - Keep thermostat turn down low to prevent drying out sinuses - For any cough or congestion- robitussin DM or Delsym as needed - For fever or aches or pains- take tylenol or ibuprofen as directed on bottle             * for fevers greater than 101 orally you may alternate ibuprofen and tylenol every 3 hours.  If you do not improve you will need a follow up visit in person.                   If you have been instructed to have an in-person evaluation today at a local Urgent Care facility, please use the  link below. It will take you to a list of all of our available [facility], including address, phone number and hours of operation. Please do not delay care.  [facility]  If you or a family member do not have a primary care provider, use the link below to schedule a visit and establish care. When you choose a [facility] primary care physician or advanced practice provider, you gain a long-term partner in health. Find a Primary Care Provider  Learn more about [facility]'s in-office and virtual care options: [facility]

## 2022-08-16 NOTE — Progress Notes (Signed)
Established patient visit   Patient: Candace Pitts   DOB: 1971-02-24   52 y.o. Female  MRN: [id number] Visit Date: 08/17/2022   Chief Complaint  Patient presents with   ADD   Subjective    HPI  Follow up  -Problems with focus and concentration.  --has been a problem for last 1 to 1.5 years and gradually worsening.  -starting to effect her job. Not yet getting into trouble.  -she is having trouble with staying on task. Takes longer to finish work assignments. Jumps around a lot  --needs to have ASRS 1.1 administered during today's visit.     Medications: Outpatient Medications Prior to Visit  Medication Sig   Loratadine 10 MG CAPS Take 10 mg by mouth daily.   Multiple Vitamin (MULTIVITAMIN) tablet Take 1 tablet by mouth daily.   promethazine-dextromethorphan (PROMETHAZINE-DM) 6.25-15 MG/5ML syrup Take 5 mLs by mouth 3 (three) times daily as needed for cough.   No facility-administered medications prior to visit.    Review of Systems  All other systems reviewed and are negative.   Last CBC Lab Results  Component Value Date   WBC 6.9 07/08/2022   HGB 14.2 07/08/2022   HCT 44.2 07/08/2022   MCV 92 07/08/2022   MCH 29.5 07/08/2022   RDW 12.5 07/08/2022   PLT 267 [id number]   Last metabolic panel Lab Results  Component Value Date   GLUCOSE 104 (H) 07/08/2022   NA 139 07/08/2022   K 4.3 07/08/2022   CL 98 07/08/2022   CO2 26 07/08/2022   BUN 15 07/08/2022   CREATININE 0.69 07/08/2022   EGFR 104 07/08/2022   CALCIUM 10.3 (H) 07/08/2022   PROT 7.3 07/08/2022   ALBUMIN 4.9 07/08/2022   LABGLOB 2.4 07/08/2022   AGRATIO 2.0 07/08/2022   BILITOT 0.5 07/08/2022   ALKPHOS 78 07/08/2022   AST 22 07/08/2022   ALT 33 (H) 07/08/2022   Last lipids Lab Results  Component Value Date   CHOL 219 (H) 07/08/2022   HDL 61 07/08/2022   LDLCALC 143 (H) 07/08/2022   TRIG 86 07/08/2022   CHOLHDL 3.6 07/08/2022   Last hemoglobin A1c Lab Results  Component Value  Date   HGBA1C 5.6 07/08/2022   Last thyroid functions Lab Results  Component Value Date   TSH 1.960 07/08/2022   Last vitamin D Lab Results  Component Value Date   VD25OH 33.6 07/08/2022       Objective     Today's Vitals   08/17/22 1448 08/17/22 1525  BP: (Abnormal) 165/93 (Abnormal) 166/96  Pulse: 67   SpO2: 100%   Weight: 160 lb 6.4 oz (72.8 kg)    Body mass index is 27.05 kg/m.  BP Readings from Last 3 Encounters:  08/17/22 (Abnormal) 166/96  07/08/22 (Abnormal) 145/81  06/11/22 (Abnormal) 165/76    Wt Readings from Last 3 Encounters:  08/17/22 160 lb 6.4 oz (72.8 kg)  07/08/22 165 lb (74.8 kg)  06/11/22 164 lb (74.4 kg)    Physical Exam Vitals and nursing note reviewed.  Constitutional:      Appearance: Normal appearance. She is well-developed.  HENT:     Head: Normocephalic and atraumatic.     Nose: Nose normal.     Mouth/Throat:     Mouth: Mucous membranes are moist.     Pharynx: Oropharynx is clear.  Eyes:     Extraocular Movements: Extraocular movements intact.     Conjunctiva/sclera: Conjunctivae normal.     Pupils: Pupils are equal,  round, and reactive to light.  Cardiovascular:     Rate and Rhythm: Normal rate and regular rhythm.     Pulses: Normal pulses.     Heart sounds: Normal heart sounds.  Pulmonary:     Effort: Pulmonary effort is normal.     Breath sounds: Normal breath sounds.  Abdominal:     Palpations: Abdomen is soft.  Musculoskeletal:        General: Normal range of motion.     Cervical back: Normal range of motion and neck supple.  Lymphadenopathy:     Cervical: No cervical adenopathy.  Skin:    General: Skin is warm and dry.     Capillary Refill: Capillary refill takes less than 2 seconds.  Neurological:     General: No focal deficit present.     Mental Status: She is alert and oriented to person, place, and time.  Psychiatric:        Mood and Affect: Mood normal.        Behavior: Behavior normal.        Thought  Content: Thought content normal.        Judgment: Judgment normal.      Assessment & Plan    1. Attention and concentration deficit Positive screen for ADD today. Trial adderall 10 mg, taking 1/2 to 1 tablet twice daily as needed to help with focus and concentration. Reassess In one month.  - amphetamine-dextroamphetamine (ADDERALL) 10 MG tablet; Take 0.5-1 tablets (5-10 mg total) by mouth 2 (two) times daily.  Dispense: 60 tablet; Refill: 0    Problem List Items Addressed This Visit       Other   Attention and concentration deficit - Primary   Relevant Medications   amphetamine-dextroamphetamine (ADDERALL) 10 MG tablet     Return in about 4 months (around 12/16/2022) for health maintenance exam, with pap, check cmp, intact PTH ionized ca. follow up 1 month med.         Ronnell Freshwater, NP  [facility] at [facility] [phone] (phone) [phone] (fax)  [place]

## 2022-08-17 ENCOUNTER — Encounter: Payer: Self-pay | Admitting: Nurse Practitioner

## 2022-08-17 ENCOUNTER — Ambulatory Visit (INDEPENDENT_AMBULATORY_CARE_PROVIDER_SITE_OTHER): Payer: PRIVATE HEALTH INSURANCE | Admitting: Nurse Practitioner

## 2022-08-17 VITALS — BP 166/96 | HR 67 | Ht 64.57 in | Wt 160.4 lb

## 2022-08-17 DIAGNOSIS — R41840 Attention and concentration deficit: Secondary | ICD-10-CM

## 2022-08-17 MED ORDER — AMPHETAMINE-DEXTROAMPHETAMINE 10 MG PO TABS: 5.0000 mg | ORAL_TABLET | Freq: Two times a day (BID) | ORAL | 0 refills | Status: DC

## 2022-09-03 ENCOUNTER — Other Ambulatory Visit: Payer: Self-pay | Admitting: Nurse Practitioner

## 2022-09-03 ENCOUNTER — Telehealth: Payer: Self-pay

## 2022-09-03 DIAGNOSIS — U071 COVID-19: Secondary | ICD-10-CM

## 2022-09-03 MED ORDER — NIRMATRELVIR/RITONAVIR (PAXLOVID)TABLET: 3.0000 | ORAL_TABLET | Freq: Two times a day (BID) | ORAL | 0 refills | Status: DC

## 2022-09-03 MED ORDER — NIRMATRELVIR/RITONAVIR (PAXLOVID)TABLET: 3.0000 | ORAL_TABLET | Freq: Two times a day (BID) | ORAL | 0 refills | Status: AC

## 2022-09-03 NOTE — Addendum Note (Signed)
Addended by: Gemma Payor on: 09/03/2022 04:11 PM   Modules accepted: Orders

## 2022-09-03 NOTE — Telephone Encounter (Signed)
Please let her know that I sent rx for paxlovid. Take as directed which is 3 pills twice daily. In addition she should also take OTC zinc, vitamin d, and vitamin c every day. Can also use otc medications as needed and as indicated for symptom management.  Thanks  -HB

## 2022-09-03 NOTE — Telephone Encounter (Signed)
Pt calling in requesting an Rx or OTC recommendations for COVID. Symptoms started yesterday 09/02/22. Tested POS today 09/03/22. Symptoms are headache, Fatigue, Slight Sore Throat, congestion.  Pharmacy: [id number] [place], [place]

## 2022-09-29 ENCOUNTER — Other Ambulatory Visit: Payer: Self-pay | Admitting: Nurse Practitioner

## 2022-09-29 DIAGNOSIS — Z1231 Encounter for screening mammogram for malignant neoplasm of breast: Secondary | ICD-10-CM

## 2022-10-01 ENCOUNTER — Ambulatory Visit (INDEPENDENT_AMBULATORY_CARE_PROVIDER_SITE_OTHER): Payer: No Typology Code available for payment source | Admitting: Nurse Practitioner

## 2022-10-01 ENCOUNTER — Encounter: Payer: Self-pay | Admitting: Nurse Practitioner

## 2022-10-01 VITALS — BP 155/81 | HR 72 | Ht 64.57 in | Wt 154.4 lb

## 2022-10-01 DIAGNOSIS — R030 Elevated blood-pressure reading, without diagnosis of hypertension: Secondary | ICD-10-CM

## 2022-10-01 DIAGNOSIS — R41840 Attention and concentration deficit: Secondary | ICD-10-CM | POA: Diagnosis not present

## 2022-10-01 MED ORDER — AMPHETAMINE-DEXTROAMPHETAMINE 10 MG PO TABS: 5.0000 mg | ORAL_TABLET | Freq: Two times a day (BID) | ORAL | 0 refills | Status: DC

## 2022-10-01 NOTE — Progress Notes (Signed)
Established patient visit   Patient: Candace Pitts   DOB: August 26, 1970   52 y.o. Female  MRN: [id number] Visit Date: 10/01/2022   Chief Complaint  Patient presents with   Medical Management of Chronic Issues   Subjective    HPI  Follow up -attention deficit --started on adderall 10 mg twice daily when needed at her last visit  --doing well without negative side effects.  --states that she is currently taking 1 tablet in the morning and sometimes taking the second tablet In the afternoons.   Blood pressure high at visit -always high at office -monitors routinely at home.  -this morning, bp was 119/78.   She denies chest pain, chest pressure, or shortness of breath. She denies headaches or visual disturbances. She denies abdominal pain, nausea, vomiting, or changes in bowel or bladder habits.     Medications: Outpatient Medications Prior to Visit  Medication Sig   Multiple Vitamin (MULTIVITAMIN) tablet Take 1 tablet by mouth daily.   promethazine-dextromethorphan (PROMETHAZINE-DM) 6.25-15 MG/5ML syrup Take 5 mLs by mouth 3 (three) times daily as needed for cough.   [DISCONTINUED] amphetamine-dextroamphetamine (ADDERALL) 10 MG tablet Take 0.5-1 tablets (5-10 mg total) by mouth 2 (two) times daily.   [DISCONTINUED] Loratadine 10 MG CAPS Take 10 mg by mouth daily.   No facility-administered medications prior to visit.    Review of Systems See HPI    Last CBC Lab Results  Component Value Date   WBC 6.9 07/08/2022   HGB 14.2 07/08/2022   HCT 44.2 07/08/2022   MCV 92 07/08/2022   MCH 29.5 07/08/2022   RDW 12.5 07/08/2022   PLT 267 07/08/2022   Last metabolic panel Lab Results  Component Value Date   GLUCOSE 104 (H) 07/08/2022   NA 139 07/08/2022   K 4.3 07/08/2022   CL 98 07/08/2022   CO2 26 07/08/2022   BUN 15 07/08/2022   CREATININE 0.69 07/08/2022   EGFR 104 07/08/2022   CALCIUM 10.3 (H) 07/08/2022   PROT 7.3 07/08/2022   ALBUMIN 4.9 07/08/2022    LABGLOB 2.4 07/08/2022   AGRATIO 2.0 07/08/2022   BILITOT 0.5 07/08/2022   ALKPHOS 78 07/08/2022   AST 22 07/08/2022   ALT 33 (H) 07/08/2022   Last lipids Lab Results  Component Value Date   CHOL 219 (H) 07/08/2022   HDL 61 07/08/2022   LDLCALC 143 (H) 07/08/2022   TRIG 86 07/08/2022   CHOLHDL 3.6 07/08/2022   Last hemoglobin A1c Lab Results  Component Value Date   HGBA1C 5.6 07/08/2022   Last thyroid functions Lab Results  Component Value Date   TSH 1.960 07/08/2022   Last vitamin D Lab Results  Component Value Date   VD25OH 33.6 07/08/2022        Objective     Today's Vitals   10/01/22 1602  BP: (Abnormal) 155/81  Pulse: 72  SpO2: 99%  Weight: 154 lb 6.4 oz (70 kg)   Body mass index is 26.04 kg/m.  BP Readings from Last 3 Encounters:  10/01/22 (Abnormal) 155/81  08/17/22 (Abnormal) 166/96  07/08/22 (Abnormal) 145/81    Wt Readings from Last 3 Encounters:  10/01/22 154 lb 6.4 oz (70 kg)  08/17/22 160 lb 6.4 oz (72.8 kg)  07/08/22 165 lb (74.8 kg)    Physical Exam Vitals and nursing note reviewed.  Constitutional:      Appearance: Normal appearance. She is well-developed.  HENT:     Head: Normocephalic and atraumatic.  Nose: Nose normal.     Mouth/Throat:     Mouth: Mucous membranes are moist.     Pharynx: Oropharynx is clear.  Eyes:     Extraocular Movements: Extraocular movements intact.     Conjunctiva/sclera: Conjunctivae normal.     Pupils: Pupils are equal, round, and reactive to light.  Neck:     Vascular: No carotid bruit.  Cardiovascular:     Rate and Rhythm: Normal rate and regular rhythm.     Pulses: Normal pulses.     Heart sounds: Normal heart sounds.  Pulmonary:     Effort: Pulmonary effort is normal.     Breath sounds: Normal breath sounds.  Abdominal:     Palpations: Abdomen is soft.  Musculoskeletal:        General: Normal range of motion.     Cervical back: Normal range of motion and neck supple.   Lymphadenopathy:     Cervical: No cervical adenopathy.  Skin:    General: Skin is warm and dry.     Capillary Refill: Capillary refill takes less than 2 seconds.  Neurological:     General: No focal deficit present.     Mental Status: She is alert and oriented to person, place, and time.  Psychiatric:        Mood and Affect: Mood normal.        Behavior: Behavior normal.        Thought Content: Thought content normal.        Judgment: Judgment normal.       Assessment & Plan    Elevated blood pressure reading without diagnosis of hypertension Assessment & Plan: Blood pressure elevated during today's visit. She does monitor at home and generally runs 120/80 or better. She will continue to monitor and keep blood pressure log. Will notify the office if high pressure continue.    Attention and concentration deficit Assessment & Plan: K to continue adderall 10 mg twice daily as needed for concentration and focus. Three 30 day prescriptions were sent to her pharmacy today.   Orders: -     Amphetamine-Dextroamphetamine; Take 0.5-1 tablets (5-10 mg total) by mouth 2 (two) times daily.  Dispense: 60 tablet; Refill: 0      Return in about 3 months (around 01/01/2023) for med refills, ADD.         Carlean Jews, NP  [facility] [phone] (phone) [phone] (fax)  [facility]

## 2022-11-08 DIAGNOSIS — I10 Essential (primary) hypertension: Secondary | ICD-10-CM | POA: Insufficient documentation

## 2022-11-08 DIAGNOSIS — R030 Elevated blood-pressure reading, without diagnosis of hypertension: Secondary | ICD-10-CM | POA: Insufficient documentation

## 2022-11-08 NOTE — Assessment & Plan Note (Signed)
K to continue adderall 10 mg twice daily as needed for concentration and focus. Three 30 day prescriptions were sent to her pharmacy today.

## 2022-11-08 NOTE — Assessment & Plan Note (Signed)
Blood pressure elevated during today's visit. She does monitor at home and generally runs 120/80 or better. She will continue to monitor and keep blood pressure log. Will notify the office if high pressure continue.

## 2022-11-12 ENCOUNTER — Ambulatory Visit
Admission: RE | Admit: 2022-11-12 | Discharge: 2022-11-12 | Disposition: A | Discharge status: Home / Self Care | Payer: No Typology Code available for payment source | Source: Ambulatory Visit | Attending: Nurse Practitioner | Admitting: Nurse Practitioner

## 2022-11-12 DIAGNOSIS — Z1231 Encounter for screening mammogram for malignant neoplasm of breast: Secondary | ICD-10-CM

## 2022-11-16 NOTE — Progress Notes (Signed)
Negative mammogram. Repeat in one year.

## 2023-01-28 ENCOUNTER — Telehealth: Payer: Self-pay | Admitting: *Deleted

## 2023-01-28 DIAGNOSIS — R41840 Attention and concentration deficit: Secondary | ICD-10-CM

## 2023-01-28 MED ORDER — AMPHETAMINE-DEXTROAMPHETAMINE 10 MG PO TABS: 5.0000 mg | ORAL_TABLET | Freq: Two times a day (BID) | ORAL | 0 refills | Status: DC

## 2023-01-28 NOTE — Telephone Encounter (Signed)
Sent enough refills to get her through her upcoming appointment with Dr. Constance Goltz on 03/15/2023.  Meds ordered this encounter  Medications   amphetamine-dextroamphetamine (ADDERALL) 10 MG tablet    Sig: Take 0.5-1 tablets (5-10 mg total) by mouth 2 (two) times daily.    Dispense:  60 tablet    Refill:  0    Fill on or after 11/27/2022    Order Specific Question:   Supervising Provider    Answer:   Nani Gasser D [[id number]]

## 2023-01-28 NOTE — Telephone Encounter (Signed)
Prescription Request  01/28/2023  LOV: 10/01/22 ROV: 03/15/23     What is the name of the medication or equipment?  amphetamine-dextroamphetamine (ADDERALL) 10 MG tablet 60 tablet    Have you contacted your pharmacy to request a refill? No   Which pharmacy would you like this sent to?  [id number] [place], [place] - [address] [place] [address] Phone: [phone] Fax: [phone]    Patient notified that their request is being sent to the clinical staff for review and that they should receive a response within 2 business days.   Please advise at Mobile [phone] (mobile)

## 2023-03-12 ENCOUNTER — Encounter: Payer: PRIVATE HEALTH INSURANCE | Admitting: Nurse Practitioner

## 2023-03-15 ENCOUNTER — Encounter: Payer: PRIVATE HEALTH INSURANCE | Admitting: Family Medicine

## 2023-03-29 ENCOUNTER — Other Ambulatory Visit (HOSPITAL_COMMUNITY)
Admission: RE | Admit: 2023-03-29 | Discharge: 2023-03-29 | Disposition: A | Discharge status: Home / Self Care | Payer: No Typology Code available for payment source | Source: Ambulatory Visit | Attending: Nurse Practitioner | Admitting: Nurse Practitioner

## 2023-03-29 ENCOUNTER — Ambulatory Visit (INDEPENDENT_AMBULATORY_CARE_PROVIDER_SITE_OTHER): Payer: No Typology Code available for payment source | Admitting: Family Medicine

## 2023-03-29 ENCOUNTER — Encounter: Payer: Self-pay | Admitting: Family Medicine

## 2023-03-29 VITALS — BP 156/96 | HR 83 | Ht 64.0 in | Wt 162.4 lb

## 2023-03-29 DIAGNOSIS — Z87891 Personal history of nicotine dependence: Secondary | ICD-10-CM | POA: Insufficient documentation

## 2023-03-29 DIAGNOSIS — Z01419 Encounter for gynecological examination (general) (routine) without abnormal findings: Secondary | ICD-10-CM | POA: Insufficient documentation

## 2023-03-29 DIAGNOSIS — R030 Elevated blood-pressure reading, without diagnosis of hypertension: Secondary | ICD-10-CM

## 2023-03-29 DIAGNOSIS — R41840 Attention and concentration deficit: Secondary | ICD-10-CM

## 2023-03-29 DIAGNOSIS — E782 Mixed hyperlipidemia: Secondary | ICD-10-CM

## 2023-03-29 MED ORDER — AMPHETAMINE-DEXTROAMPHETAMINE 10 MG PO TABS: 5.0000 mg | ORAL_TABLET | Freq: Two times a day (BID) | ORAL | 0 refills | Status: DC

## 2023-03-29 NOTE — Patient Instructions (Signed)
It was nice to see you today,  We addressed the following topics today: -I will recheck your cholesterol and calcium level today - I will send in a referral for a low-dose CT scan to check for lung cancer.  This is recommended to do yearly for anybody that has smoked for as long as you have.   Have a great day,  Frederic Jericho, MD

## 2023-03-29 NOTE — Assessment & Plan Note (Signed)
Well-controlled.  Continue current medication.  Refill sent in for 80-month supply.

## 2023-03-29 NOTE — Assessment & Plan Note (Signed)
Elevated at previous visit and elevated again today.  Discuss starting medication for her blood pressure at next visit.

## 2023-03-29 NOTE — Progress Notes (Unsigned)
   Annual physical  Subjective   Patient ID: Candace Pitts, female    DOB: April 23, 1971  Age: 52 y.o. MRN: [id number]  Chief Complaint  Patient presents with   Annual Exam   Gynecologic Exam   HPI Candace Pitts is a 52 y.o. old female here  for annual exam.   Changes in his/her health in the last 12 months: no  The patient currently works as a Tax adviser for [employer].She is married to a female partner.  She does not use tobacco since 2010, prior to that smoked for over 20 years.  Drinks 3 days perweek, does not use recreational drugs.  The patient eats a regular diet.  Started Candace Pitts weight loss last year.  Is doing well with this.  Eats between the hours of 12 and 8 PM.  Eats lean meats, fruits and vegetables.  States she recently had a colonoscopy at [facility] with Dr. Loreta Ave.  Was told to follow-up in 10 years.  The patient has the following chronic health issues that are monitored on a routine basis: ADHD-tolerating her current medication well.  Taking Adderall 10 mg   HPI  The 10-year ASCVD risk score (Arnett DK, et al., 2019) is: 1.8%  Health Maintenance Due  Topic Date Due   HIV Screening  Never done   Hepatitis C Screening  Never done   Colonoscopy  Never done   Zoster Vaccines- Shingrix (1 of 2) Never done   PAP SMEAR-Modifier  06/19/2019   COVID-19 Vaccine (4 - 2023-24 season) 03/21/2023      Objective:     BP (!) 156/96   Pulse 83   Ht 5\' 4"  (1.626 m)   Wt 162 lb 6.4 oz (73.7 kg)   SpO2 100%   BMI 27.88 kg/m    Physical Exam General: Alert, oriented HEENT: PERRLA, EOMI CV: Regular rate rhythm no murmurs Pulmonary: Lungs are bilaterally GU: Normal-appearing vagina.  No discharge.  No bleeding.  Normal appearing cervix. MSK: Strength equal bilaterally Extremities: No pedal edema.      Assessment & Plan:   Well woman exam -     Cytology - PAP  Moderate mixed hyperlipidemia not requiring statin therapy -     Lipid  panel  Hypercalcemia -     Comprehensive metabolic panel  Former smoker Assessment & Plan: Qualifies for yearly CT lung cancer screenings.  Patient okay with having this time. CT chest low-dose ordered.  Orders: -     CT CHEST LUNG CANCER SCREENING LOW DOSE WO CONTRAST; Future  Attention and concentration deficit Assessment & Plan: Well-controlled.  Continue current medication.  Refill sent in for 42-month supply.  Orders: -     Amphetamine-Dextroamphetamine; Take 0.5-1 tablets (5-10 mg total) by mouth 2 (two) times daily.  Dispense: 60 tablet; Refill: 0 -     Amphetamine-Dextroamphetamine; Take 0.5-1 tablets (5-10 mg total) by mouth 2 (two) times daily.  Dispense: 60 tablet; Refill: 0 -     Amphetamine-Dextroamphetamine; Take 0.5-1 tablets (5-10 mg total) by mouth 2 (two) times daily.  Dispense: 60 tablet; Refill: 0  Elevated blood pressure reading without diagnosis of hypertension Assessment & Plan: Elevated at previous visit and elevated again today.  Discuss starting medication for her blood pressure at next visit.      Return in about 3 months (around 06/28/2023) for adhd.    Sandre Kitty, MD

## 2023-03-29 NOTE — Assessment & Plan Note (Signed)
Qualifies for yearly CT lung cancer screenings.  Patient okay with having this time. CT chest low-dose ordered.

## 2023-03-30 LAB — COMPREHENSIVE METABOLIC PANEL WITH GFR
ALT: 38 IU/L — ABNORMAL HIGH (ref 0–32)
AST: 28 IU/L (ref 0–40)
Albumin: 4.6 g/dL (ref 3.8–4.9)
Alkaline Phosphatase: 84 IU/L (ref 44–121)
BUN/Creatinine Ratio: 27 — ABNORMAL HIGH (ref 9–23)
BUN: 18 mg/dL (ref 6–24)
Bilirubin Total: 0.7 mg/dL (ref 0.0–1.2)
CO2: 23 mmol/L (ref 20–29)
Calcium: 9.4 mg/dL (ref 8.7–10.2)
Chloride: 100 mmol/L (ref 96–106)
Creatinine, Ser: 0.66 mg/dL (ref 0.57–1.00)
Globulin, Total: 2.5 g/dL (ref 1.5–4.5)
Glucose: 102 mg/dL — ABNORMAL HIGH (ref 70–99)
Potassium: 4.1 mmol/L (ref 3.5–5.2)
Sodium: 139 mmol/L (ref 134–144)
Total Protein: 7.1 g/dL (ref 6.0–8.5)
eGFR: 104 mL/min/1.73

## 2023-03-30 LAB — LIPID PANEL
Chol/HDL Ratio: 2.5 ratio (ref 0.0–4.4)
Cholesterol, Total: 244 mg/dL — ABNORMAL HIGH (ref 100–199)
HDL: 98 mg/dL
LDL Chol Calc (NIH): 136 mg/dL — ABNORMAL HIGH (ref 0–99)
Triglycerides: 58 mg/dL (ref 0–149)
VLDL Cholesterol Cal: 10 mg/dL (ref 5–40)

## 2023-03-31 LAB — CYTOLOGY - PAP
Adequacy: ABSENT
Comment: NEGATIVE
Diagnosis: NEGATIVE
High risk HPV: NEGATIVE

## 2023-04-06 ENCOUNTER — Telehealth: Payer: Self-pay | Admitting: *Deleted

## 2023-04-06 ENCOUNTER — Other Ambulatory Visit: Payer: Self-pay | Admitting: Family Medicine

## 2023-04-06 DIAGNOSIS — R41840 Attention and concentration deficit: Secondary | ICD-10-CM

## 2023-04-06 MED ORDER — AMPHETAMINE-DEXTROAMPHETAMINE 10 MG PO TABS: 5.0000 mg | ORAL_TABLET | Freq: Two times a day (BID) | ORAL | 0 refills | Status: DC

## 2023-04-06 NOTE — Telephone Encounter (Signed)
Pt calling to say the [facility] does NOT take her insurance and she would like [facility]

## 2023-04-06 NOTE — Telephone Encounter (Signed)
Please let pt know her prescription has been sent to the new pharmacy.   Please call previous pharmacy and cancel her prior prescription.

## 2023-06-28 ENCOUNTER — Ambulatory Visit: Payer: No Typology Code available for payment source | Admitting: Family Medicine

## 2023-06-28 NOTE — Progress Notes (Unsigned)
   Established Patient Office Visit  Subjective   Patient ID: Antoinique Costea, female    DOB: 10/09/1970  Age: 52 y.o. MRN: [id number]  No chief complaint on file.   HPI  ADHD  Hypertension-elevated at last visit  Colonoscopy-recently with Dr. Loreta Ave, still do not see it in her media tabs.  Need to send out request  Tobacco use-did she get a low-dose CT scan?   The 10-year ASCVD risk score (Arnett DK, et al., 2019) is: 1.8%  Health Maintenance Due  Topic Date Due   HIV Screening  Never done   Hepatitis C Screening  Never done   Colonoscopy  Never done   Zoster Vaccines- Shingrix (1 of 2) Never done   COVID-19 Vaccine (4 - 2023-24 season) 03/21/2023      Objective:     There were no vitals taken for this visit. {Vitals History (Optional):23777}  Physical Exam   No results found for any visits on 06/28/23.      Assessment & Plan:   There are no diagnoses linked to this encounter.   No follow-ups on file.    Sandre Kitty, MD

## 2023-07-05 ENCOUNTER — Ambulatory Visit (INDEPENDENT_AMBULATORY_CARE_PROVIDER_SITE_OTHER): Payer: No Typology Code available for payment source | Admitting: Family Medicine

## 2023-07-05 ENCOUNTER — Encounter: Payer: Self-pay | Admitting: Family Medicine

## 2023-07-05 VITALS — BP 171/89 | HR 84 | Ht 64.0 in | Wt 166.0 lb

## 2023-07-05 DIAGNOSIS — R41840 Attention and concentration deficit: Secondary | ICD-10-CM | POA: Diagnosis not present

## 2023-07-05 DIAGNOSIS — Z87891 Personal history of nicotine dependence: Secondary | ICD-10-CM

## 2023-07-05 DIAGNOSIS — Z23 Encounter for immunization: Secondary | ICD-10-CM

## 2023-07-05 DIAGNOSIS — R030 Elevated blood-pressure reading, without diagnosis of hypertension: Secondary | ICD-10-CM

## 2023-07-05 MED ORDER — AMPHETAMINE-DEXTROAMPHETAMINE 10 MG PO TABS: 5.0000 mg | ORAL_TABLET | Freq: Two times a day (BID) | ORAL | 0 refills | Status: DC

## 2023-07-05 NOTE — Progress Notes (Signed)
   Established Patient Office Visit  Subjective   Patient ID: Candace Pitts, female    DOB: 1971-03-07  Age: 52 y.o. MRN: [id number]  Chief Complaint  Patient presents with   Medical Management of Chronic Issues    HPI  ADHD -patient is taking her Adderall.  No issues with this..  Needs a new prescription.  Hypertension-patient states her blood pressure at home is always in the 120s.  Only high when she comes to the doctor.  We discussed keeping a log of her blood pressures and bringing them to [place] so that we can review them.  Patient had a colonoscopy with Dr. Loreta Ave.  She states she was advised to follow-up in 10 years.  Patient has not yet scheduled her CT scan for lung cancer screening.  Advised her to reach out to [facility] imaging to have this done.   The 10-year ASCVD risk score (Arnett DK, et al., 2019) is: 2.2%  Health Maintenance Due  Topic Date Due   HIV Screening  Never done   Hepatitis C Screening  Never done   COVID-19 Vaccine (4 - 2024-25 season) 03/21/2023      Objective:     BP (!) 171/89   Pulse 84   Ht 5\' 4"  (1.626 m)   Wt 166 lb (75.3 kg)   SpO2 100%   BMI 28.49 kg/m    Physical Exam General: Alert, oriented Pulmonary: No respiratory distress Psych: Pleasant affect   No results found for any visits on 07/05/23.      Assessment & Plan:   Elevated blood pressure reading without diagnosis of hypertension Assessment & Plan: Patient states that at home her blood pressure readings are normal.  Advised her to document these at least 1-2 times a day for 1 to 2 weeks and bring in the results within the next month.  We can then discuss whether or not to start medication.   Attention and concentration deficit Assessment & Plan: No issues with this medication.  Tolerates it well.  Needs new refill.  Orders: -     Amphetamine-Dextroamphetamine; Take 0.5-1 tablets (5-10 mg total) by mouth 2 (two) times daily.  Dispense: 60 tablet; Refill:  0 -     Amphetamine-Dextroamphetamine; Take 0.5-1 tablets (5-10 mg total) by mouth 2 (two) times daily.  Dispense: 60 tablet; Refill: 0 -     Amphetamine-Dextroamphetamine; Take 0.5-1 tablets (5-10 mg total) by mouth 2 (two) times daily.  Dispense: 60 tablet; Refill: 0  Former smoker Assessment & Plan: Has not scheduled lung cancer screening yet.  Order is in.  Advised her to reach out to [facility] imaging to schedule this.      Return in about 3 months (around 10/03/2023) for HTN, adhd.    Sandre Kitty, MD

## 2023-07-05 NOTE — Patient Instructions (Signed)
It was nice to see you today,  We addressed the following topics today: -I have sent in refills of your Adderall.  For 3 months - I would like you to check your blood pressure 1-2 times at home for at least 2 weeks.  When you finish your can bring the results back to [place] so that I can review them and discuss them with you. - I have included a log book in the after visit paperwork for you to document this on.  Have a great day,  Frederic Jericho, MD

## 2023-07-05 NOTE — Assessment & Plan Note (Signed)
No issues with this medication.  Tolerates it well.  Needs new refill.

## 2023-07-05 NOTE — Assessment & Plan Note (Signed)
Has not scheduled lung cancer screening yet.  Order is in.  Advised her to reach out to [facility] imaging to schedule this.

## 2023-07-05 NOTE — Assessment & Plan Note (Signed)
Patient states that at home her blood pressure readings are normal.  Advised her to document these at least 1-2 times a day for 1 to 2 weeks and bring in the results within the next month.  We can then discuss whether or not to start medication.

## 2023-10-05 ENCOUNTER — Ambulatory Visit (INDEPENDENT_AMBULATORY_CARE_PROVIDER_SITE_OTHER): Payer: No Typology Code available for payment source | Admitting: Family Medicine

## 2023-10-05 ENCOUNTER — Encounter: Payer: Self-pay | Admitting: Family Medicine

## 2023-10-05 VITALS — BP 147/90 | HR 81 | Ht 64.0 in | Wt 168.4 lb

## 2023-10-05 DIAGNOSIS — K760 Fatty (change of) liver, not elsewhere classified: Secondary | ICD-10-CM | POA: Diagnosis not present

## 2023-10-05 DIAGNOSIS — R41840 Attention and concentration deficit: Secondary | ICD-10-CM | POA: Diagnosis not present

## 2023-10-05 DIAGNOSIS — Z87891 Personal history of nicotine dependence: Secondary | ICD-10-CM

## 2023-10-05 DIAGNOSIS — I10 Essential (primary) hypertension: Secondary | ICD-10-CM

## 2023-10-05 MED ORDER — AMPHETAMINE-DEXTROAMPHETAMINE 10 MG PO TABS: 5.0000 mg | ORAL_TABLET | Freq: Two times a day (BID) | ORAL | 0 refills | Status: DC

## 2023-10-05 NOTE — Assessment & Plan Note (Signed)
 If the patient's goal is less than 130/85 then on average her home readings are above this goal.  Again she is even more elevated at our visit.  Discussed reasons to be on blood pressure medication and ramifications of not controlling blood pressure.  Patient declined medication at this time.

## 2023-10-05 NOTE — Assessment & Plan Note (Signed)
 Possibly a combination of NAFLD and alcoholic fatty liver disease.  Endorsed alcohol consumption 4 to 5 days a week, combination of multiple drinks per day during those days including beer and liquor. Body mass index is 28.91 kg/m.  Discussed dietary modifications and recommended exercise as well as reduction of alcohol use.  Will get CMP and CBC.

## 2023-10-05 NOTE — Assessment & Plan Note (Addendum)
 Compliant with current medication.  No changes needed.  Will prescribe 3 more months of Adderall worth and follow-up in 3 months.

## 2023-10-05 NOTE — Progress Notes (Signed)
 Established Patient Office Visit  Subjective   Patient ID: Candace Pitts, female    DOB: Jul 07, 1971  Age: 53 y.o. MRN: [id number]  Chief Complaint  Patient presents with   Medical Management of Chronic Issues    HPI  Hypertension-we discussed the patient's home readings for her blood pressure which she brought in.  Those readings range from 116- 145 systolic and most of her diastolic readings were above 85.  Her home reading today was 131/87.  Reading in our office was higher than that.  We discussed reasons to be on blood pressure medications including reducing risk of heart attack stroke or kidney disease.  Discussed CCB's, HCTZ, ARB's.  Patient declines medication at this time.  ADHD-patient takes her Adderall generally 1 tablet in the morning and a half a tablet or full tablet in afternoon.  Former smoker-patient quit 15 years ago.  She has not yet scheduled a CT scan of her lungs.  We discussed scheduling this.  Hepatic steatosis-we discussed the patient's recent CMP's that showed elevated ALT's.  We discussed her right upper quadrant ultrasound from 2020 that showed fatty infiltration.  Discussed causes of this including metabolic and alcoholic fatty liver.  Patient consumes alcohol 4 to 5 days a week, combination of beer and liquor.   The 10-year ASCVD risk score (Arnett DK, et al., 2019) is: 1.6%  Health Maintenance Due  Topic Date Due   HIV Screening  Never done   Hepatitis C Screening  Never done   Zoster Vaccines- Shingrix (1 of 2) Never done   COVID-19 Vaccine (4 - 2024-25 season) 03/21/2023      Objective:     BP (!) 147/90   Pulse 81   Ht 5\' 4"  (1.626 m)   Wt 168 lb 6.4 oz (76.4 kg)   SpO2 99%   BMI 28.91 kg/m    Physical Exam General: Alert and oriented. Pulmonary: No respiratory distress Psych: Pleasant affect, spontaneous speech, good eye contact.   No results found for any visits on 10/05/23.      Assessment & Plan:   Essential  hypertension Assessment & Plan: If the patient's goal is less than 130/85 then on average her home readings are above this goal.  Again she is even more elevated at our visit.  Discussed reasons to be on blood pressure medication and ramifications of not controlling blood pressure.  Patient declined medication at this time.   Attention and concentration deficit Assessment & Plan: Compliant with current medication.  No changes needed.  Will prescribe 3 more months of Adderall worth and follow-up in 3 months.  Orders: -     Amphetamine-Dextroamphetamine; Take 0.5-1 tablets (5-10 mg total) by mouth 2 (two) times daily.  Dispense: 60 tablet; Refill: 0 -     Amphetamine-Dextroamphetamine; Take 0.5-1 tablets (5-10 mg total) by mouth 2 (two) times daily.  Dispense: 60 tablet; Refill: 0 -     Amphetamine-Dextroamphetamine; Take 0.5-1 tablets (5-10 mg total) by mouth 2 (two) times daily.  Dispense: 60 tablet; Refill: 0  Former smoker Assessment & Plan: Has not scheduled CT scan of the lungs.  Advised her to schedule this.  Order is still valid for the time being   Hepatic steatosis Assessment & Plan: Possibly a combination of NAFLD and alcoholic fatty liver disease.  Endorsed alcohol consumption 4 to 5 days a week, combination of multiple drinks per day during those days including beer and liquor. Body mass index is 28.91 kg/m.  Discussed dietary  modifications and recommended exercise as well as reduction of alcohol use.  Will get CMP and CBC.       Return in about 3 months (around 01/05/2024) for ADHD, HTN.    Sandre Kitty, MD

## 2023-10-05 NOTE — Assessment & Plan Note (Signed)
 Has not scheduled CT scan of the lungs.  Advised her to schedule this.  Order is still valid for the time being

## 2023-10-05 NOTE — Patient Instructions (Signed)
 It was nice to see you today,  We addressed the following topics today: -Your blood pressure readings at home are still on average above goal of 130/85 or less.  The best thing to do would be to start a blood pressure medication such as amlodipine so if you change your mind regarding this let us know - Your previous ultrasounds and liver function tests show that you have something called fatty liver or hepatic steatosis.  This is caused by alcohol use as well as excess weight and dietary choices.  Try to limit your alcohol use to 2 drinks or less per week.  I would also recommend routine cardiovascular exercise such as brisk walking or light jogging 5 days a week for least 30 minutes, limiting your saturated fat and carbohydrate intake.  Have a great day,  Frederic Jericho, MD

## 2023-10-12 ENCOUNTER — Other Ambulatory Visit: Payer: Self-pay | Admitting: *Deleted

## 2023-10-12 DIAGNOSIS — R5383 Other fatigue: Secondary | ICD-10-CM

## 2023-10-12 DIAGNOSIS — E782 Mixed hyperlipidemia: Secondary | ICD-10-CM

## 2023-10-12 DIAGNOSIS — I10 Essential (primary) hypertension: Secondary | ICD-10-CM

## 2023-10-15 ENCOUNTER — Other Ambulatory Visit

## 2023-12-09 ENCOUNTER — Other Ambulatory Visit

## 2023-12-09 DIAGNOSIS — I10 Essential (primary) hypertension: Secondary | ICD-10-CM

## 2023-12-09 DIAGNOSIS — R5383 Other fatigue: Secondary | ICD-10-CM

## 2023-12-09 DIAGNOSIS — E782 Mixed hyperlipidemia: Secondary | ICD-10-CM

## 2023-12-10 LAB — CBC WITH DIFFERENTIAL/PLATELET
Basophils Absolute: 0.1 10*3/uL (ref 0.0–0.2)
Basos: 1 %
EOS (ABSOLUTE): 0.3 10*3/uL (ref 0.0–0.4)
Eos: 4 %
Hematocrit: 42.9 % (ref 34.0–46.6)
Hemoglobin: 14 g/dL (ref 11.1–15.9)
Immature Grans (Abs): 0 10*3/uL (ref 0.0–0.1)
Immature Granulocytes: 0 %
Lymphocytes Absolute: 2.2 10*3/uL (ref 0.7–3.1)
Lymphs: 33 %
MCH: 30.9 pg (ref 26.6–33.0)
MCHC: 32.6 g/dL (ref 31.5–35.7)
MCV: 95 fL (ref 79–97)
Monocytes Absolute: 0.5 10*3/uL (ref 0.1–0.9)
Monocytes: 8 %
Neutrophils Absolute: 3.7 10*3/uL (ref 1.4–7.0)
Neutrophils: 54 %
Platelets: 298 10*3/uL (ref 150–450)
RBC: 4.53 x10E6/uL (ref 3.77–5.28)
RDW: 12.1 % (ref 11.7–15.4)
WBC: 6.8 10*3/uL (ref 3.4–10.8)

## 2023-12-10 LAB — LIPID PANEL
Chol/HDL Ratio: 3.9 ratio (ref 0.0–4.4)
Cholesterol, Total: 254 mg/dL — ABNORMAL HIGH (ref 100–199)
HDL: 65 mg/dL (ref >39)
LDL Chol Calc (NIH): 159 mg/dL — ABNORMAL HIGH (ref 0–99)
Triglycerides: 168 mg/dL — ABNORMAL HIGH (ref 0–149)
VLDL Cholesterol Cal: 30 mg/dL (ref 5–40)

## 2023-12-10 LAB — COMPREHENSIVE METABOLIC PANEL WITH GFR
ALT: 23 IU/L (ref 0–32)
AST: 19 IU/L (ref 0–40)
Albumin: 4.6 g/dL (ref 3.8–4.9)
Alkaline Phosphatase: 69 IU/L (ref 44–121)
BUN/Creatinine Ratio: 21 (ref 9–23)
BUN: 15 mg/dL (ref 6–24)
Bilirubin Total: 0.4 mg/dL (ref 0.0–1.2)
CO2: 20 mmol/L (ref 20–29)
Calcium: 9.4 mg/dL (ref 8.7–10.2)
Chloride: 101 mmol/L (ref 96–106)
Creatinine, Ser: 0.7 mg/dL (ref 0.57–1.00)
Globulin, Total: 2.4 g/dL (ref 1.5–4.5)
Glucose: 99 mg/dL (ref 70–99)
Potassium: 4.5 mmol/L (ref 3.5–5.2)
Sodium: 140 mmol/L (ref 134–144)
Total Protein: 7 g/dL (ref 6.0–8.5)
eGFR: 102 mL/min/{1.73_m2} (ref >59)

## 2023-12-15 ENCOUNTER — Ambulatory Visit: Payer: Self-pay | Admitting: Family Medicine

## 2023-12-30 ENCOUNTER — Other Ambulatory Visit: Payer: Self-pay | Admitting: Family Medicine

## 2023-12-30 DIAGNOSIS — Z1231 Encounter for screening mammogram for malignant neoplasm of breast: Secondary | ICD-10-CM

## 2023-12-31 ENCOUNTER — Ambulatory Visit
Admission: RE | Admit: 2023-12-31 | Discharge: 2023-12-31 | Disposition: A | Discharge status: Home / Self Care | Source: Ambulatory Visit | Attending: Family Medicine

## 2023-12-31 DIAGNOSIS — Z1231 Encounter for screening mammogram for malignant neoplasm of breast: Secondary | ICD-10-CM

## 2024-01-03 ENCOUNTER — Telehealth: Payer: Self-pay

## 2024-01-03 NOTE — Telephone Encounter (Signed)
 Copied from CRM [id number]. Topic: Clinical - Medication Prior Auth >> Jan 03, 2024 12:00 PM Carlatta H wrote: Reason for CRM: Please call back Tanya Fantasia from [facility] [phone]//she needs to know if prior authorization was done >> Jan 03, 2024  1:54 PM Corin V wrote: Tanya Fantasia with [employer] calling to check on auth for the CT Sie[employer] is showing as expired in December 2024. New Siegfried Dress is needed. Tanya Fantasia stated that [employer] was needed by 1pm today and the CT will be cancelled until the auth is completed and uploaded to referral. Please call Tanya Fantasia back when Siegfried [employer] is completed. P; [phone]

## 2024-01-04 ENCOUNTER — Inpatient Hospital Stay: Admission: RE | Admit: 2024-01-04 | Source: Ambulatory Visit

## 2024-01-05 ENCOUNTER — Ambulatory Visit (INDEPENDENT_AMBULATORY_CARE_PROVIDER_SITE_OTHER): Admitting: Family Medicine

## 2024-01-05 ENCOUNTER — Encounter: Payer: Self-pay | Admitting: Family Medicine

## 2024-01-05 VITALS — BP 160/90 | HR 81 | Ht 66.0 in | Wt 171.0 lb

## 2024-01-05 DIAGNOSIS — I10 Essential (primary) hypertension: Secondary | ICD-10-CM | POA: Diagnosis not present

## 2024-01-05 DIAGNOSIS — R41840 Attention and concentration deficit: Secondary | ICD-10-CM

## 2024-01-05 MED ORDER — AMPHETAMINE-DEXTROAMPHETAMINE 10 MG PO TABS: 5.0000 mg | ORAL_TABLET | Freq: Two times a day (BID) | ORAL | 0 refills | Status: AC

## 2024-01-05 MED ORDER — AMPHETAMINE-DEXTROAMPHETAMINE 10 MG PO TABS: 5.0000 mg | ORAL_TABLET | Freq: Two times a day (BID) | ORAL | 0 refills | Status: DC

## 2024-01-05 NOTE — Progress Notes (Unsigned)
   Established Patient Office Visit  Subjective   Patient ID: Roshawn Lacina, female    DOB: September 07, 1970  Age: 53 y.o. MRN: [id number]  No chief complaint on file.   HPI  HTN   adhd   The 10-year ASCVD risk score (Arnett DK, et al., 2019) is: 3.9%  Health Maintenance Due  Topic Date Due   HIV Screening  Never done   Hepatitis C Screening  Never done   Zoster Vaccines- Shingrix (1 of 2) Never done   COVID-19 Vaccine (4 - 2024-25 season) 03/21/2023      Objective:     There were no vitals taken for this visit. {Vitals History (Optional):23777}  Physical Exam   No results found for any visits on 01/05/24.      Assessment & Plan:   There are no diagnoses linked to this encounter.   No follow-ups on file.    Toribio MARLA Slain, MD

## 2024-01-05 NOTE — Patient Instructions (Signed)
 It was nice to see you today,  We addressed the following topics today: -I will resend in your medications for 3 months.  After that I will resend them in for an additional 3 months. - I can see you back in 6 months for your physical - See if your blood pressure cuff at home will connect to a [employer] app that can track your blood pressure electronically.  This will make it easier to record home values.  Have a great day,  Etha Henle, MD

## 2024-01-11 NOTE — Assessment & Plan Note (Signed)
-   BP elevated in office but normal at home    - Continue home monitoring    - Recommended using [employer] app to track readings    - No medication indicated at this time

## 2024-01-11 NOTE — Assessment & Plan Note (Signed)
-   Currently stable on Adderall    - Continue current dose    - Prescription renewed for 3 months

## 2024-01-26 ENCOUNTER — Other Ambulatory Visit

## 2024-01-27 ENCOUNTER — Ambulatory Visit
Admission: RE | Admit: 2024-01-27 | Discharge: 2024-01-27 | Disposition: A | Discharge status: Home / Self Care | Source: Ambulatory Visit | Attending: Family Medicine | Admitting: Family Medicine

## 2024-01-27 DIAGNOSIS — Z87891 Personal history of nicotine dependence: Secondary | ICD-10-CM

## 2024-02-04 ENCOUNTER — Ambulatory Visit: Payer: Self-pay | Admitting: Family Medicine

## 2024-02-21 ENCOUNTER — Telehealth: Payer: Self-pay | Admitting: *Deleted

## 2024-02-21 NOTE — Telephone Encounter (Signed)
 Copied from CRM #[id number]. Topic: Clinical - Medical Advice >> Feb 21, 2024  1:59 PM Cleave MATSU wrote: Reason for CRM: she's interested in going on the weight loss medication wegovy she wants to know can Dr. Chandra just prescribe that to her or will she have to come in. Please follow up with pt about this.

## 2024-02-28 ENCOUNTER — Other Ambulatory Visit: Payer: Self-pay | Admitting: Family Medicine

## 2024-02-28 MED ORDER — WEGOVY 0.25 MG/0.5ML ~~LOC~~ SOAJ: 0.2500 mg | SUBCUTANEOUS | 1 refills | Status: AC

## 2024-04-07 ENCOUNTER — Other Ambulatory Visit: Payer: Self-pay | Admitting: Family Medicine

## 2024-04-07 DIAGNOSIS — R41840 Attention and concentration deficit: Secondary | ICD-10-CM

## 2024-05-04 ENCOUNTER — Other Ambulatory Visit: Payer: Self-pay | Admitting: Family Medicine

## 2024-05-04 DIAGNOSIS — R41840 Attention and concentration deficit: Secondary | ICD-10-CM

## 2024-05-25 ENCOUNTER — Encounter: Payer: Self-pay | Admitting: Family Medicine

## 2024-05-25 DIAGNOSIS — I10 Essential (primary) hypertension: Secondary | ICD-10-CM

## 2024-05-25 DIAGNOSIS — E782 Mixed hyperlipidemia: Secondary | ICD-10-CM

## 2024-05-25 DIAGNOSIS — K760 Fatty (change of) liver, not elsewhere classified: Secondary | ICD-10-CM

## 2024-05-25 DIAGNOSIS — R7301 Impaired fasting glucose: Secondary | ICD-10-CM

## 2024-05-25 NOTE — Telephone Encounter (Signed)
 Patient is schedule

## 2024-06-08 ENCOUNTER — Other Ambulatory Visit: Payer: Self-pay | Admitting: Family Medicine

## 2024-06-08 DIAGNOSIS — R41840 Attention and concentration deficit: Secondary | ICD-10-CM

## 2024-06-27 ENCOUNTER — Other Ambulatory Visit

## 2024-06-27 DIAGNOSIS — I10 Essential (primary) hypertension: Secondary | ICD-10-CM

## 2024-06-27 DIAGNOSIS — E782 Mixed hyperlipidemia: Secondary | ICD-10-CM

## 2024-06-27 DIAGNOSIS — K760 Fatty (change of) liver, not elsewhere classified: Secondary | ICD-10-CM

## 2024-06-27 DIAGNOSIS — R7301 Impaired fasting glucose: Secondary | ICD-10-CM

## 2024-07-04 ENCOUNTER — Other Ambulatory Visit: Payer: Self-pay | Admitting: Family Medicine

## 2024-07-04 DIAGNOSIS — R41840 Attention and concentration deficit: Secondary | ICD-10-CM

## 2024-07-06 ENCOUNTER — Other Ambulatory Visit

## 2024-07-07 ENCOUNTER — Encounter: Payer: Self-pay | Admitting: Family Medicine

## 2024-07-07 ENCOUNTER — Encounter: Admitting: Family Medicine

## 2024-07-07 ENCOUNTER — Ambulatory Visit: Payer: Self-pay

## 2024-07-07 VITALS — BP 142/82 | HR 82 | Ht 66.0 in | Wt 185.5 lb

## 2024-07-07 DIAGNOSIS — Z6829 Body mass index (BMI) 29.0-29.9, adult: Secondary | ICD-10-CM | POA: Insufficient documentation

## 2024-07-07 DIAGNOSIS — E782 Mixed hyperlipidemia: Secondary | ICD-10-CM | POA: Diagnosis not present

## 2024-07-07 DIAGNOSIS — F1090 Alcohol use, unspecified, uncomplicated: Secondary | ICD-10-CM | POA: Diagnosis not present

## 2024-07-07 DIAGNOSIS — R41840 Attention and concentration deficit: Secondary | ICD-10-CM | POA: Diagnosis not present

## 2024-07-07 DIAGNOSIS — K760 Fatty (change of) liver, not elsewhere classified: Secondary | ICD-10-CM

## 2024-07-07 DIAGNOSIS — E785 Hyperlipidemia, unspecified: Secondary | ICD-10-CM | POA: Insufficient documentation

## 2024-07-07 DIAGNOSIS — Z0000 Encounter for general adult medical examination without abnormal findings: Secondary | ICD-10-CM | POA: Diagnosis not present

## 2024-07-07 DIAGNOSIS — Z23 Encounter for immunization: Secondary | ICD-10-CM

## 2024-07-07 MED ORDER — AMPHETAMINE-DEXTROAMPHETAMINE 10 MG PO TABS: 5.0000 mg | ORAL_TABLET | Freq: Two times a day (BID) | ORAL | 0 refills | Status: DC

## 2024-07-07 MED ORDER — AMPHETAMINE-DEXTROAMPHETAMINE 10 MG PO TABS: 5.0000 mg | ORAL_TABLET | Freq: Two times a day (BID) | ORAL | 0 refills | Status: AC

## 2024-07-07 NOTE — Assessment & Plan Note (Signed)
 Difficulty losing weight despite lifestyle changes. Interested in pharmacological options not covered by insurance. Discussed alternative weight loss programs covered by insurance. - Sent referral to healthy weight wellness program. - Discuss insurance coverage for weight loss programs.

## 2024-07-07 NOTE — Assessment & Plan Note (Signed)
 Up to date with mammogram and CT lung cancer screening. Discussed vaccinations. - Discussed shingles and pneumococcal vaccines.

## 2024-07-07 NOTE — Assessment & Plan Note (Signed)
 Requires ongoing management with Adderall. - Continue Adderall prescription.

## 2024-07-07 NOTE — Assessment & Plan Note (Signed)
 Moderately elevated LDL cholesterol, improved from previous levels. Triglycerides mildly elevated, likely due to sugar intake. - Continue monitoring cholesterol levels.

## 2024-07-07 NOTE — Assessment & Plan Note (Signed)
 Recent elevation in liver enzymes. Discussed liver disease progression and potential need for elastography. Alcohol use may contribute to enzyme elevation. - Ordered elastography ultrasound to assess liver fibrosis. - Advised to check with insurance regarding coverage for ultrasound. - Discussed potential reduction in alcohol consumption.

## 2024-07-07 NOTE — Progress Notes (Signed)
 "  Established Patient Office Visit  Subjective   Patient ID: Candace Pitts, female    DOB: [birth year]  Age: 53 y.o. MRN: [id number]  Chief Complaint  Patient presents with   Annual Exam      History of Present Illness   Candace Pitts is a 53 year old female who presents with difficulty losing weight despite lifestyle modifications.  She has been experiencing difficulty losing weight despite engaging in intermittent fasting, maintaining a healthy diet, and regular exercise on the Pel[employer]. She mentions gaining weight despite following previous weight loss recommendations.  She has tried various weight loss medications and programs, including Wegovy , which was not covered by her insurance. She is considering other options like Alara, a shot her sister-in-law in [place]  has used with good results. She is hesitant about trying new pills due to past experiences with ineffective products.  She has a history of fatty liver diagnosed in 2020, with recent blood work showing elevated liver enzymes (AST increased from 20 to 50, ALT from 23 to 47). Her cholesterol levels are moderately elevated, with LDL at 146 and triglycerides mildly elevated. She consumes alcohol, typically two beers and a few shots on weekends.  She is currently taking Adderall and has not been diagnosed with sleep apnea or other significant cardiovascular conditions. No history of heart attack, stroke, or peripheral artery disease.       The 10-year ASCVD risk score (Arnett DK, et al., 2019) is: 4.2%  Health Maintenance Due  Topic Date Due   HIV Screening  Never done   Hepatitis C Screening  Never done   Hepatitis B Vaccines 19-59 Average Risk (1 of 3 - 19+ 3-dose series) Never done   Zoster Vaccines- Shingrix (1 of 2) Never done   Pneumococcal Vaccine: 50+ Years (1 of 1 - PCV) Never done   Influenza Vaccine  02/18/2024   COVID-19 Vaccine (4 - 2025-26 season) 03/20/2024      Objective:     BP (!)  149/93   Pulse 82   Ht 5' 6 (1.676 m)   Wt 185 lb 8 oz (84.1 kg)   SpO2 98%   BMI 29.94 kg/m    Physical Exam     Gen: alert, oriented HEENT: perrla, eomi, mmm CV: rrr, no murmur Pulm: lctab. No wheeze or crackles.  GI: soft, nbs.  Nontender to palpation MSK: strength equal b/l. Normal gait Ext: no pedal edema Skin: warm and dry, no rashes Psych: pleasant affect.  Spontaneous speech       No results found for any visits on 07/07/24.      Assessment & Plan:   Physical exam, annual  BMI 29.0-29.9,adult Assessment & Plan: Difficulty losing weight despite lifestyle changes. Interested in pharmacological options not covered by insurance. Discussed alternative weight loss programs covered by insurance. - Sent referral to healthy weight wellness program. - Discuss insurance coverage for weight loss programs.  Orders: -     Amb Ref to Medical Weight Management  Hepatic steatosis Assessment & Plan: Recent elevation in liver enzymes. Discussed liver disease progression and potential need for elastography. Alcohol use may contribute to enzyme elevation. - Ordered elastography ultrasound to assess liver fibrosis. - Advised to check with insurance regarding coverage for ultrasound. - Discussed potential reduction in alcohol consumption.  Orders: -     Amb Ref to Medical Weight Management  Attention and concentration deficit Assessment & Plan: Requires ongoing management with Adderall. - Continue Adderall prescription.   Healthcare  maintenance Assessment & Plan: Up to date with mammogram and CT lung cancer screening. Discussed vaccinations. - Discussed shingles and pneumococcal vaccines.    Alcohol use Assessment & Plan: Weekend alcohol consumption may contribute to elevated liver enzymes and liver disease progression. - Discussed potential reduction in alcohol consumption.   Moderate mixed hyperlipidemia not requiring statin therapy Assessment &  Plan: Moderately elevated LDL cholesterol, improved from previous levels. Triglycerides mildly elevated, likely due to sugar intake. - Continue monitoring cholesterol levels.      Return in about 6 months (around 01/05/2025) for hld, HTN, weight, add.    Toribio MARLA Slain, MD  "

## 2024-07-07 NOTE — Assessment & Plan Note (Signed)
 Weekend alcohol consumption may contribute to elevated liver enzymes and liver disease progression. - Discussed potential reduction in alcohol consumption.

## 2024-07-07 NOTE — Patient Instructions (Signed)
" °  VISIT SUMMARY: Today, we discussed your ongoing difficulty with weight loss despite your efforts with diet, exercise, and intermittent fasting. We also reviewed your history of fatty liver disease, elevated liver enzymes, and cholesterol levels. Additionally, we talked about your alcohol consumption and its potential impact on your liver health. Lastly, we reviewed your current management of ADHD and general health maintenance, including vaccinations.  YOUR PLAN: -OBESITY: Obesity is a condition characterized by excessive body fat. We discussed your challenges with weight loss and explored alternative weight loss programs that may be covered by your insurance. A referral to a healthy weight wellness program has been sent, and you should check with your insurance about coverage for these programs.  -FATTY LIVER DISEASE WITH ELEVATED LIVER ENZYMES: Fatty liver disease occurs when fat builds up in the liver, which can lead to liver damage. Your recent blood work showed elevated liver enzymes, indicating potential liver stress. We ordered an elastography ultrasound to assess liver fibrosis and discussed the importance of reducing alcohol consumption to help manage your liver health. Please check with your insurance regarding coverage for the ultrasound.  -HYPERLIPIDEMIA: Hyperlipidemia is a condition where there are high levels of fats (lipids) in the blood, such as cholesterol and triglycerides. Your LDL cholesterol is moderately elevated, and triglycerides are mildly elevated, likely due to sugar intake. We will continue to monitor your cholesterol levels.  -ALCOHOL USE: Your weekend alcohol consumption may be contributing to elevated liver enzymes and the progression of liver disease. We discussed the potential benefits of reducing your alcohol intake to improve your liver health.  -ATTENTION DEFICIT HYPERACTIVITY DISORDER (ADHD): ADHD is a condition characterized by symptoms of inattention,  hyperactivity, and impulsivity. You are currently managing this condition with Adderall, and we will continue your prescription as it is.  -GENERAL HEALTH MAINTENANCE: You are up to date with your mammogram and CT lung cancer screening. We discussed the importance of vaccinations, including the shingles and pneumococcal vaccines.  INSTRUCTIONS: Please follow up with the healthy weight wellness program as referred. Check with your insurance regarding coverage for the elastography ultrasound. Consider reducing your alcohol consumption to help manage your liver health. Continue taking Adderall as prescribed. Stay up to date with your vaccinations, including shingles and pneumococcal vaccines.         "

## 2024-07-08 ENCOUNTER — Encounter: Payer: Self-pay | Admitting: Family Medicine

## 2024-07-08 LAB — CBC WITH DIFFERENTIAL/PLATELET
Basophils Absolute: 0.1 x10E3/uL (ref 0.0–0.2)
Basos: 1 %
EOS (ABSOLUTE): 0.3 x10E3/uL (ref 0.0–0.4)
Eos: 6 %
Hematocrit: 43.3 % (ref 34.0–46.6)
Hemoglobin: 13.8 g/dL (ref 11.1–15.9)
Immature Grans (Abs): 0 x10E3/uL (ref 0.0–0.1)
Immature Granulocytes: 0 %
Lymphocytes Absolute: 1.9 x10E3/uL (ref 0.7–3.1)
Lymphs: 30 %
MCH: 30.2 pg (ref 26.6–33.0)
MCHC: 31.9 g/dL (ref 31.5–35.7)
MCV: 95 fL (ref 79–97)
Monocytes Absolute: 0.5 x10E3/uL (ref 0.1–0.9)
Monocytes: 9 %
Neutrophils Absolute: 3.4 x10E3/uL (ref 1.4–7.0)
Neutrophils: 54 %
Platelets: 296 x10E3/uL (ref 150–450)
RBC: 4.57 x10E6/uL (ref 3.77–5.28)
RDW: 12.9 % (ref 11.7–15.4)
WBC: 6.2 x10E3/uL (ref 3.4–10.8)

## 2024-07-08 LAB — TSH: TSH: 2.42 u[IU]/mL (ref 0.450–4.500)

## 2024-07-08 LAB — LIPID PANEL
Chol/HDL Ratio: 4.1 ratio (ref 0.0–4.4)
Cholesterol, Total: 236 mg/dL — ABNORMAL HIGH (ref 100–199)
HDL: 57 mg/dL
LDL Chol Calc (NIH): 146 mg/dL — ABNORMAL HIGH (ref 0–99)
Triglycerides: 186 mg/dL — ABNORMAL HIGH (ref 0–149)
VLDL Cholesterol Cal: 33 mg/dL (ref 5–40)

## 2024-07-08 LAB — COMPREHENSIVE METABOLIC PANEL WITH GFR
ALT: 47 IU/L — ABNORMAL HIGH (ref 0–32)
AST: 49 IU/L — ABNORMAL HIGH (ref 0–40)
Albumin: 4.5 g/dL (ref 3.8–4.9)
Alkaline Phosphatase: 63 IU/L (ref 49–135)
BUN/Creatinine Ratio: 17 (ref 9–23)
BUN: 12 mg/dL (ref 6–24)
Bilirubin Total: 0.5 mg/dL (ref 0.0–1.2)
CO2: 23 mmol/L (ref 20–29)
Calcium: 9.4 mg/dL (ref 8.7–10.2)
Chloride: 101 mmol/L (ref 96–106)
Creatinine, Ser: 0.72 mg/dL (ref 0.57–1.00)
Globulin, Total: 2.2 g/dL (ref 1.5–4.5)
Glucose: 114 mg/dL — ABNORMAL HIGH (ref 70–99)
Potassium: 4.8 mmol/L (ref 3.5–5.2)
Sodium: 138 mmol/L (ref 134–144)
Total Protein: 6.7 g/dL (ref 6.0–8.5)
eGFR: 99 mL/min/1.73

## 2024-07-08 LAB — HEMOGLOBIN A1C
Est. average glucose Bld gHb Est-mCnc: 126 mg/dL
Hgb A1c MFr Bld: 6 % — AB (ref 4.8–5.6)

## 2024-07-27 ENCOUNTER — Encounter (INDEPENDENT_AMBULATORY_CARE_PROVIDER_SITE_OTHER): Payer: Self-pay

## 2024-08-07 ENCOUNTER — Other Ambulatory Visit (HOSPITAL_BASED_OUTPATIENT_CLINIC_OR_DEPARTMENT_OTHER): Payer: Self-pay

## 2024-08-07 ENCOUNTER — Telehealth: Payer: Self-pay

## 2024-08-07 NOTE — Telephone Encounter (Signed)
 Copied from CRM [id number]. Topic: Clinical - Medication Question >> Aug 07, 2024 12:15 PM Gustabo D wrote: Pt is wanting to switch her pharmacy-  [facility] [address]  [place] [address]

## 2024-08-10 ENCOUNTER — Other Ambulatory Visit (HOSPITAL_BASED_OUTPATIENT_CLINIC_OR_DEPARTMENT_OTHER): Payer: Self-pay

## 2024-08-10 ENCOUNTER — Telehealth: Payer: Self-pay | Admitting: *Deleted

## 2024-08-10 NOTE — Telephone Encounter (Signed)
 This was sent to [facility] originally for January and February and she is now wanting it to go to [facility].  Please advise.

## 2024-08-10 NOTE — Telephone Encounter (Signed)
 Copied from CRM #[id number]. Topic: Clinical - Medication Refill >> Aug 10, 2024  4:27 PM Delon HERO wrote: Medication: amphetamine -dextroamphetamine  (ADDERALL) 10 MG tablet [[id number]]  Has the patient contacted their pharmacy? Yes (Agent: If no, request that the patient contact the pharmacy for the refill. If patient does not wish to contact the pharmacy document the reason why and proceed with request.) (Agent: If yes, when and what did the pharmacy advise?)  This is the patient's preferred pharmacy:  [facility] [place] [facility] [address] [place] [address] Phone: [phone] Fax: [phone]  Is this the correct pharmacy for this prescription? Yes If no, delete pharmacy and type the correct one.   Has the prescription been filled recently? Yes  Is the patient out of the medication? Yes  Has the patient been seen for an appointment in the last year OR does the patient have an upcoming appointment? Yes  Can we respond through MyChart? Yes  Agent: Please be advised that Rx refills may take up to 3 business days. We ask that you follow-up with your pharmacy.

## 2024-08-11 ENCOUNTER — Other Ambulatory Visit: Payer: Self-pay | Admitting: Family Medicine

## 2024-08-11 ENCOUNTER — Other Ambulatory Visit (HOSPITAL_BASED_OUTPATIENT_CLINIC_OR_DEPARTMENT_OTHER): Payer: Self-pay

## 2024-08-11 DIAGNOSIS — R41840 Attention and concentration deficit: Secondary | ICD-10-CM

## 2024-08-11 MED ORDER — AMPHETAMINE-DEXTROAMPHETAMINE 10 MG PO TABS
5.0000 mg | ORAL_TABLET | Freq: Two times a day (BID) | ORAL | 0 refills | Status: AC
  Filled 2024-08-11: qty 60, 30d supply, fill #0

## 2024-08-11 NOTE — Telephone Encounter (Signed)
 Medication has been sent in to [facility] [place]

## 2024-12-29 ENCOUNTER — Other Ambulatory Visit

## 2025-01-05 ENCOUNTER — Ambulatory Visit: Admitting: Family Medicine
